# Patient Record
Sex: Female | Born: 1935 | Race: White | Hispanic: No | Smoking: Former smoker
Health system: Southern US, Community
[De-identification: ages and names within clinical notes are randomized; demographics above are authoritative.]

## PROBLEM LIST (undated history)

## (undated) DIAGNOSIS — Z9889 Other specified postprocedural states: Secondary | ICD-10-CM

## (undated) DIAGNOSIS — K219 Gastro-esophageal reflux disease without esophagitis: Secondary | ICD-10-CM

## (undated) DIAGNOSIS — I10 Essential (primary) hypertension: Secondary | ICD-10-CM

## (undated) DIAGNOSIS — Z01810 Encounter for preprocedural cardiovascular examination: Secondary | ICD-10-CM

## (undated) DIAGNOSIS — I4891 Unspecified atrial fibrillation: Secondary | ICD-10-CM

## (undated) DIAGNOSIS — Z8719 Personal history of other diseases of the digestive system: Secondary | ICD-10-CM

## (undated) DIAGNOSIS — D509 Iron deficiency anemia, unspecified: Secondary | ICD-10-CM

## (undated) DIAGNOSIS — E785 Hyperlipidemia, unspecified: Secondary | ICD-10-CM

## (undated) DIAGNOSIS — I2510 Atherosclerotic heart disease of native coronary artery without angina pectoris: Secondary | ICD-10-CM

## (undated) DIAGNOSIS — I219 Acute myocardial infarction, unspecified: Secondary | ICD-10-CM

## (undated) HISTORY — DX: Atherosclerotic heart disease of native coronary artery without angina pectoris: I25.10

## (undated) HISTORY — DX: Hyperlipidemia, unspecified: E78.5

## (undated) HISTORY — DX: Essential (primary) hypertension: I10

## (undated) HISTORY — DX: Unspecified atrial fibrillation: I48.91

## (undated) HISTORY — DX: Personal history of other diseases of the digestive system: Z87.19

## (undated) HISTORY — PX: EYE SURGERY: SHX253

## (undated) HISTORY — DX: Other specified postprocedural states: Z98.890

## (undated) HISTORY — DX: Iron deficiency anemia, unspecified: D50.9

## (undated) HISTORY — DX: Encounter for preprocedural cardiovascular examination: Z01.810

---

## 1999-03-19 ENCOUNTER — Inpatient Hospital Stay (HOSPITAL_COMMUNITY): Admission: EM | Admit: 1999-03-19 | Discharge: 1999-03-29 | Payer: Self-pay | Admitting: Emergency Medicine

## 1999-03-19 ENCOUNTER — Encounter: Payer: Self-pay | Admitting: Emergency Medicine

## 1999-03-26 ENCOUNTER — Encounter: Payer: Self-pay | Admitting: Cardiology

## 1999-03-27 ENCOUNTER — Encounter: Payer: Self-pay | Admitting: *Deleted

## 2000-03-20 ENCOUNTER — Emergency Department (HOSPITAL_COMMUNITY): Admission: EM | Admit: 2000-03-20 | Discharge: 2000-03-20 | Payer: Self-pay | Admitting: Emergency Medicine

## 2000-03-20 ENCOUNTER — Encounter: Payer: Self-pay | Admitting: Emergency Medicine

## 2000-03-31 HISTORY — PX: LAPAROSCOPIC CHOLECYSTECTOMY: SUR755

## 2000-05-20 ENCOUNTER — Emergency Department (HOSPITAL_COMMUNITY): Admission: EM | Admit: 2000-05-20 | Discharge: 2000-05-20 | Payer: Self-pay | Admitting: Emergency Medicine

## 2000-05-20 ENCOUNTER — Encounter: Payer: Self-pay | Admitting: General Surgery

## 2000-05-20 ENCOUNTER — Encounter: Payer: Self-pay | Admitting: Emergency Medicine

## 2000-05-21 ENCOUNTER — Observation Stay (HOSPITAL_COMMUNITY): Admission: RE | Admit: 2000-05-21 | Discharge: 2000-05-22 | Payer: Self-pay | Admitting: General Surgery

## 2000-05-21 ENCOUNTER — Encounter: Payer: Self-pay | Admitting: General Surgery

## 2000-05-21 ENCOUNTER — Encounter (INDEPENDENT_AMBULATORY_CARE_PROVIDER_SITE_OTHER): Payer: Self-pay | Admitting: Specialist

## 2003-05-22 ENCOUNTER — Inpatient Hospital Stay (HOSPITAL_COMMUNITY): Admission: EM | Admit: 2003-05-22 | Discharge: 2003-05-24 | Payer: Self-pay

## 2004-03-15 ENCOUNTER — Emergency Department (HOSPITAL_COMMUNITY): Admission: EM | Admit: 2004-03-15 | Discharge: 2004-03-15 | Payer: Self-pay | Admitting: *Deleted

## 2004-07-11 ENCOUNTER — Ambulatory Visit: Payer: Self-pay | Admitting: Cardiology

## 2004-07-17 ENCOUNTER — Encounter: Payer: Self-pay | Admitting: Cardiology

## 2004-07-17 ENCOUNTER — Ambulatory Visit: Payer: Self-pay

## 2004-07-25 ENCOUNTER — Ambulatory Visit: Payer: Self-pay | Admitting: Cardiology

## 2004-10-09 ENCOUNTER — Ambulatory Visit: Payer: Self-pay | Admitting: Cardiology

## 2004-12-24 ENCOUNTER — Encounter: Admission: RE | Admit: 2004-12-24 | Discharge: 2004-12-24 | Payer: Self-pay | Admitting: Orthopedic Surgery

## 2007-01-26 ENCOUNTER — Ambulatory Visit: Payer: Self-pay | Admitting: Cardiology

## 2008-01-12 ENCOUNTER — Ambulatory Visit: Payer: Self-pay | Admitting: Cardiology

## 2008-11-22 ENCOUNTER — Encounter: Payer: Self-pay | Admitting: Cardiology

## 2009-02-02 DIAGNOSIS — D509 Iron deficiency anemia, unspecified: Secondary | ICD-10-CM | POA: Insufficient documentation

## 2009-02-06 ENCOUNTER — Encounter: Payer: Self-pay | Admitting: Cardiology

## 2009-02-07 ENCOUNTER — Ambulatory Visit: Payer: Self-pay | Admitting: Cardiology

## 2009-02-27 ENCOUNTER — Ambulatory Visit: Payer: Self-pay | Admitting: Cardiology

## 2009-03-31 HISTORY — PX: UMBILICAL HERNIA REPAIR: SHX196

## 2009-05-09 ENCOUNTER — Encounter: Payer: Self-pay | Admitting: Cardiology

## 2009-05-17 ENCOUNTER — Telehealth: Payer: Self-pay | Admitting: Cardiology

## 2009-05-28 ENCOUNTER — Telehealth (INDEPENDENT_AMBULATORY_CARE_PROVIDER_SITE_OTHER): Payer: Self-pay | Admitting: *Deleted

## 2009-05-29 ENCOUNTER — Ambulatory Visit (HOSPITAL_COMMUNITY): Admission: RE | Admit: 2009-05-29 | Discharge: 2009-05-30 | Payer: Self-pay | Admitting: General Surgery

## 2009-06-14 ENCOUNTER — Encounter (INDEPENDENT_AMBULATORY_CARE_PROVIDER_SITE_OTHER): Payer: Self-pay | Admitting: *Deleted

## 2009-06-18 ENCOUNTER — Encounter: Payer: Self-pay | Admitting: Cardiology

## 2009-08-01 ENCOUNTER — Ambulatory Visit: Payer: Self-pay | Admitting: Cardiology

## 2010-03-14 ENCOUNTER — Ambulatory Visit: Payer: Self-pay | Admitting: Cardiology

## 2010-03-14 ENCOUNTER — Encounter: Payer: Self-pay | Admitting: Cardiology

## 2010-04-30 NOTE — Letter (Signed)
Summary: [facility] Surgical Clearance   Imported By: Roderic Ovens 06/01/2009 14:09:12  _____________________________________________________________________  External Attachment:    Type:   Image     Comment:   External Document

## 2010-04-30 NOTE — Progress Notes (Signed)
   Faxed 12 lead over to [facility] @ cone to fax [phone] [facility]  May 28, 2009 11:06 AM

## 2010-04-30 NOTE — Letter (Signed)
Summary: Appointment - Reminder 2  [employer], Main Office  [address]   [place], [place] [address]   Phone: [phone]  Fax: [phone]     June 14, 2009 MRN: [id number]   Martha Price [address] [place], [place]  [address]   Dear Ms. Gang,  Our records indicate that it is time to schedule a follow-up appointment with Dr. Myrtis Ser. It is very important that we reach you to schedule this appointment. We look forward to participating in your health care needs. Please contact us at the number listed above at your earliest convenience to schedule your appointment.  If you are unable to make an appointment at this time, give [place] a call so we can update our records.     Sincerely,   Migdalia Dk [facility] Scheduling Team

## 2010-04-30 NOTE — Assessment & Plan Note (Signed)
Summary: f69m   Visit Type:  Follow-up Primary Provider:  Wylene Simmer, MD  CC:  atrial fibrillation.  History of Present Illness: The patient is seen for followup of atrial fibrillation and coronary disease.  She is stable.  After long discussions in the past she has chosen not to take Coumadin.  On this basis I will also not recommend Pradaxa.  She's not having any chest pain.  She has no palpitations.  Current Medications (verified): 1)  Metoprolol Succinate 50 Mg Xr24h-Tab (Metoprolol Succinate) .... Take A  1/2 Tablet By Mouth Daily 2)  Pravastatin Sodium 40 Mg Tabs (Pravastatin Sodium) .... Take One Tablet By Mouth Daily At Bedtime 3)  Ramipril 10 Mg Caps (Ramipril) .... Take One Capsule By Mouth Two Times A Day 4)  Digoxin 0.25 Mg Tabs (Digoxin) .... Take One Tablet By Mouth Daily 5)  Aspirin 81 Mg Tbec (Aspirin) .... Take One Tablet By Mouth Daily 6)  Nitrostat 0.4 Mg Subl (Nitroglycerin) .Marland Kitchen.. 1 Tablet Under Tongue At Onset of Chest Pain; You May Repeat Every 5 Minutes For Up To 3 Doses. 7)  Icaps  Caps (Multiple Vitamins-Minerals) .... 2 Tabs Daily 8)  Xalatan 0.005 % Soln (Latanoprost) .... Uad 9)  Tylenol Extra Strength 500 Mg Tabs (Acetaminophen) .... As Needed 10)  Tums 500 Mg Chew (Calcium Carbonate Antacid) .... 3-4 Daily As Needed  Allergies (verified): No Known Drug Allergies  Past History:  Past Medical History: Dyslipidemia HYPERTENSION (ICD-401.9) ANEMIA, IRON DEFICIENCY (ICD-280.9) CAD... remote anterior MI with PTCA to the LAD  /   DES.Marland Kitchen RCA  February, 2005.. 70% circumflex at that time  /   nuclear scan.Marland Kitchen April, 2006... EF 75%... mild ischemia distal anterior wall Atrial fibrillation... episode in the remote past  /  atrial fibrillation noted  February 07, 2009 Umbilical hernia repair... March, 2011...Dr. Abbey Chatters    Review of Systems       Patient denies fever, chills, headache, sweats, rash, change in vision, change in hearing, chest pain, cough, nausea  vomiting, urinary symptoms.  All the systems are reviewed and are negative.  Vital Signs:  Patient profile:   77 year old female Height:      63.5 inches Weight:      126 pounds BMI:     22.05 Pulse rate:   75 / minute Pulse rhythm:   irregular BP sitting:   184 / 72  (left arm) Cuff size:   regular  Vitals Entered By: Stanton Kidney, EMT-P (Aug 01, 2009 11:22 AM)  Physical Exam  General:  she is quite stable in general. Eyes:  no xanthelasma. Neck:  no jugular venous distention. Lungs:  lungs are clear.  Respiratory effort is nonlabored. Heart:  cardiac exam reveals an S1-S2.  No clicks or significant murmurs.  The rhythm is irregularly irregular. Abdomen:  abdomen is soft. Extremities:  no peripheral edema. Psych:  patient is oriented to person time and place affect is normal.   Impression & Recommendations:  Problem # 1:  ATRIAL FIBRILLATION, PAROXYSMAL (ICD-427.31)  Her updated medication list for this problem includes:    Metoprolol Succinate 50 Mg Xr24h-tab (Metoprolol succinate) .Marland Kitchen... Take a  1/2 tablet by mouth daily    Digoxin 0.25 Mg Tabs (Digoxin) .Marland Kitchen... Take one tablet by mouth daily    Aspirin 81 Mg Tbec (Aspirin) .Marland Kitchen... Take one tablet by mouth daily  The patient has atrial fibrillation.  Rate is controlled.  Patient refuses Coumadin.  I feel that it is not warranted  to recommend Pradaxa in this setting..  She is on aspirin.  Orders: EKG w/ Interpretation (93000)  Problem # 2:  CAD (ICD-414.00)  Her updated medication list for this problem includes:    Metoprolol Succinate 50 Mg Xr24h-tab (Metoprolol succinate) .Marland Kitchen... Take a  1/2 tablet by mouth daily    Ramipril 10 Mg Caps (Ramipril) .Marland Kitchen... Take one capsule by mouth two times a day    Aspirin 81 Mg Tbec (Aspirin) .Marland Kitchen... Take one tablet by mouth daily    Nitrostat 0.4 Mg Subl (Nitroglycerin) .Marland Kitchen... 1 tablet under tongue at onset of chest pain; you may repeat every 5 minutes for up to 3 doses. Coronary disease is  stable.  EKG is done and reviewed by me.  There is atrial fibrillation with a controlled rate and nonspecific ST-T wave changes.  Problem # 3:  HYPERTENSION (ICD-401.9)  Her updated medication list for this problem includes:    Metoprolol Succinate 50 Mg Xr24h-tab (Metoprolol succinate) .Marland Kitchen... Take a  1/2 tablet by mouth daily    Ramipril 10 Mg Caps (Ramipril) .Marland Kitchen... Take one capsule by mouth two times a day    Aspirin 81 Mg Tbec (Aspirin) .Marland Kitchen... Take one tablet by mouth daily systolic blood pressure is elevated today.  I recommended that she have followup blood pressure checks and see her primary M.D.  Patient Instructions: 1)  Follow up with your Primary Care MD regarding your elevated BP today 2)  Follow up with Dr Myrtis Ser in 6 months

## 2010-04-30 NOTE — Letter (Signed)
Summary: Dr Jim Desanctis Rosenbower's Office Note  Dr Jim Desanctis Rosenbower's Office Note   Imported By: Roderic Ovens 07/12/2009 14:03:43  _____________________________________________________________________  External Attachment:    Type:   Image     Comment:   External Document

## 2010-04-30 NOTE — Progress Notes (Signed)
Summary: surg clearance   ---- Converted from flag ---- ---- 05/14/2009 11:13 AM, Cala Bradford Mesiemore wrote: Forrestine Him,  Spoke with Dr.Rosenbauer's office they are needing Surgical Clearnace on this Pt.   [phone] Fax:[phone]    Thanks, Selena Batten ------------------------------  Phone Note Other Incoming   Summary of Call: per Dr Myrtis Ser pt is cleared, surg clearance form was faxed back to [facility], RN  May 17, 2009 10:43 AM

## 2010-05-02 NOTE — Assessment & Plan Note (Signed)
Summary: f24m  Medications Added TIMOLOL MALEATE 0.5 % SOLN (TIMOLOL MALEATE) once daily      Allergies Added: NKDA  Visit Type:  Follow-up Primary Martha Price:  Guerry Bruin, MD  CC:  atrial fibrillation.  History of Present Illness: The patient is seen for followup of atrial fibrillation.  She has refused Coumadin in the past.  She is stable.  She has coronary disease and is stable.  She's not having chest pain or shortness of breath. She does not feel any significant palpitations.  Current Medications (verified): 1)  Metoprolol Succinate 50 Mg Xr24h-Tab (Metoprolol Succinate) .... Take A  1/2 Tablet By Mouth Daily 2)  Pravastatin Sodium 40 Mg Tabs (Pravastatin Sodium) .... Take One Tablet By Mouth Daily At Bedtime 3)  Ramipril 10 Mg Caps (Ramipril) .... Take One Capsule By Mouth Two Times A Day 4)  Digoxin 0.25 Mg Tabs (Digoxin) .... Take One Tablet By Mouth Daily 5)  Aspirin 81 Mg Tbec (Aspirin) .... Take One Tablet By Mouth Daily 6)  Nitrostat 0.4 Mg Subl (Nitroglycerin) .Marland Kitchen.. 1 Tablet Under Tongue At Onset of Chest Pain; You May Repeat Every 5 Minutes For Up To 3 Doses. 7)  Icaps  Caps (Multiple Vitamins-Minerals) .... 2 Tabs Daily 8)  Xalatan 0.005 % Soln (Latanoprost) .... Uad 9)  Tylenol Extra Strength 500 Mg Tabs (Acetaminophen) .... As Needed 10)  Tums 500 Mg Chew (Calcium Carbonate Antacid) .... 3-4 Daily As Needed 11)  Timolol Maleate 0.5 % Soln (Timolol Maleate) .... Once Daily  Allergies (verified): No Known Drug Allergies  Past History:  Past Medical History: Dyslipidemia HYPERTENSION (ICD-401.9) ANEMIA, IRON DEFICIENCY (ICD-280.9) CAD... remote anterior MI with PTCA to the LAD  /   DES.Marland Kitchen RCA  February, 2005.. 70% circumflex at that time  /   nuclear scan.Marland Kitchen April, 2006... EF 75%... mild ischemia distal anterior wall Atrial fibrillation... episode in the remote past  /  atrial fibrillation noted  February 07, 2009 Umbilical hernia repair... March, 2011...Dr.  Abbey Chatters..    Review of Systems       Patient denies fever, chills, headache, sweats, rash, change in vision, change in hearing, chest pain, cough, nausea vomiting, urinary symptoms.  All other systems are reviewed and are negative  Vital Signs:  Patient profile:   77 year old female Height:      63.5 inches Weight:      129 pounds BMI:     22.57 Pulse rate:   70 / minute BP sitting:   124 / 72  (left arm) Cuff size:   regular  Vitals Entered By: Hardin Negus, RMA (March 14, 2010 3:06 PM)  Physical Exam  General:  patient looks quite good today. Eyes:  no xanthelasma Neck:  no jugular distention. Lungs:  lungs are clear.  Respiratory effort is nonlabored. Heart:  cardiac exam reveals S1-S2.  No clicks or significant murmurs. Abdomen:  abdomen is soft Extremities:  no peripheral edema. Psych:  patient is oriented to person time and place her affect is normal.   Impression & Recommendations:  Problem # 1:  ATRIAL FIBRILLATION, PAROXYSMAL (ICD-427.31)  Her updated medication list for this problem includes:    Metoprolol Succinate 50 Mg Xr24h-tab (Metoprolol succinate) .Marland Kitchen... Take a  1/2 tablet by mouth daily    Digoxin 0.25 Mg Tabs (Digoxin) .Marland Kitchen... Take one tablet by mouth daily    Aspirin 81 Mg Tbec (Aspirin) .Marland Kitchen... Take one tablet by mouth daily The patient's atrial fib is very stable.  She is  on digoxin.  She's had a good level in the past but this is a high dose for a patient of her age.  Digoxin level will be checked today.  Orders: T-Digoxin ([id number])  Problem # 2:  CAD (ICD-414.00)  Her updated medication list for this problem includes:    Metoprolol Succinate 50 Mg Xr24h-tab (Metoprolol succinate) .Marland Kitchen... Take a  1/2 tablet by mouth daily    Ramipril 10 Mg Caps (Ramipril) .Marland Kitchen... Take one capsule by mouth two times a day    Aspirin 81 Mg Tbec (Aspirin) .Marland Kitchen... Take one tablet by mouth daily    Nitrostat 0.4 Mg Subl (Nitroglycerin) .Marland Kitchen... 1 tablet under tongue  at onset of chest pain; you may repeat every 5 minutes for up to 3 doses. Coronary disease is stable.  She needs no further workup.  Problem # 3:  HYPERTENSION (ICD-401.9)  Her updated medication list for this problem includes:    Metoprolol Succinate 50 Mg Xr24h-tab (Metoprolol succinate) .Marland Kitchen... Take a  1/2 tablet by mouth daily    Ramipril 10 Mg Caps (Ramipril) .Marland Kitchen... Take one capsule by mouth two times a day    Aspirin 81 Mg Tbec (Aspirin) .Marland Kitchen... Take one tablet by mouth daily The time of her last visit her blood pressure was somewhat elevated.  It is under control today.  She thinks an additional medicine has been added but I can document only the addition of her timolol eyedrops. Regardless, her blood pressure is stable, and no therapy changes needed.  Patient Instructions: 1)  Your physician wants you to follow-up in:  6 months.  You will receive a reminder letter in the mail two months in advance. If you don't receive a letter, please call our office to schedule the follow-up appointment.

## 2010-06-21 LAB — COMPREHENSIVE METABOLIC PANEL
ALT: 16 U/L (ref 0–35)
AST: 27 U/L (ref 0–37)
Albumin: 3.7 g/dL (ref 3.5–5.2)
Alkaline Phosphatase: 52 U/L (ref 39–117)
BUN: 13 mg/dL (ref 6–23)
CO2: 29 mEq/L (ref 19–32)
GFR calc Af Amer: >60 mL/min (ref >60)
Total Bilirubin: 0.5 mg/dL (ref 0.3–1.2)
Total Protein: 6.6 g/dL (ref 6.0–8.3)

## 2010-06-21 LAB — DIFFERENTIAL
Basophils Absolute: 0 10*3/uL (ref 0.0–0.1)
Basophils Relative: 1 % (ref 0–1)
Eosinophils Absolute: 0.2 10*3/uL (ref 0.0–0.7)
Eosinophils Relative: 2 % (ref 0–5)
Monocytes Absolute: 0.6 10*3/uL (ref 0.1–1.0)
Monocytes Relative: 8 % (ref 3–12)
Neutro Abs: 4.2 10*3/uL (ref 1.7–7.7)

## 2010-06-21 LAB — CBC
Hemoglobin: 13.6 g/dL (ref 12.0–15.0)
MCHC: 34.3 g/dL (ref 30.0–36.0)
MCV: 97.6 fL (ref 78.0–100.0)
RBC: 4.08 MIL/uL (ref 3.87–5.11)
RDW: 13.8 % (ref 11.5–15.5)

## 2010-08-13 NOTE — Assessment & Plan Note (Signed)
[facility]                            CARDIOLOGY OFFICE NOTE   Martha Price, Martha Price                       MRN:          [id number]  DATE:01/26/2007                            DOB:          18-Nov-1921    Martha Price is doing very well.  I have not seen her since July of  2006.  She has been stable.  She has not been having any chest pain.  She is watched carefully by Dr. Wylene Simmer.  She is going about full  activities.  She did receive a Cypher stent in 2005 and has been on  aspirin and Plavix since then.   PAST MEDICAL HISTORY:  Allergies:  No known drug allergies.  Medications:  Lanoxin, Altace, Plavix (to be stopped today), aspirin 81,  Pravachol 40, Toprol XL.  Other medical problems:  See the list below.   REVIEW OF SYSTEMS:  She is doing well and her review of systems is  negative.   PHYSICAL EXAMINATION:  Blood pressure is 150/76 with a pulse of 61.  The  patient is oriented to person, time and place and her affect is normal.  HEENT:  Reveals no xanthelasmas.  She has normal extraocular motion.  NECK:  There are no carotid bruits.  There is no jugular venous  distention.  LUNGS:  Clear.  Respiratory effort is not labored.  CARDIAC:  Exam reveals a S1, S2.  There are clicks or significant  murmurs.  She has no peripheral edema.  EKG reveals sinus rhythm.   PROBLEMS INCLUDE:  1. Coronary disease with a Cypher stent 2005, stable.  She does not      need any testing at this time.  2. Normal LV function.  3. Hypercholesterolemia on medication.  4. Episode of atrial fibrillation in the remote past with no      recurrence.  5. History of GERD.   She is stable at this point.  She will need blood pressure followup with  Dr. Wylene Simmer.  She is now 3.5 years out from her Cypher stent.  I feel it  is most appropriate in her case to stop her Plavix and to remain on  aspirin and I instructed her to do so.  I will see her back in one year.     Martha Abed, MD, [facility]  Electronically Signed    JDK/MedQ  DD: 01/26/2007  DT: 01/26/2007  Job #: [id number]   cc:   Martha Price, M.D.

## 2010-08-13 NOTE — Assessment & Plan Note (Signed)
[facility]                            CARDIOLOGY OFFICE NOTE   EMMANUEL, ERCOLE                       MRN:          [id number]  DATE:01/12/2008                            DOB:          04-05-1921    Ms. Liao is here for the followup of her coronary artery disease.  I  saw her last on January 26, 2007.  She is stable.  She has some leg  discomfort, but no chest pain.  She had a Cypher stent placed in 2005,  and we stopped her Plavix in 2008 and she has been stable without any  difficulties.  She is not having chest pain or shortness of breath.  She  has no syncope or presyncope.   PAST MEDICAL HISTORY:   ALLERGIES:  No known drug allergies.   MEDICATIONS:  Lanoxin, aspirin, Pravachol, metoprolol, and ramipril.   OTHER MEDICAL PROBLEMS:  See the list below.   REVIEW OF SYSTEMS:  She is not having any GI or GU symptoms.  She is not  having any headaches or fevers.  There is no arthralgias.  She does have  some leg discomfort.  Otherwise, review of systems is negative.   PHYSICAL EXAMINATION:  VITAL SIGNS:  Blood pressure today systolic is  somewhat elevated at 176/66.  We will ask her to follow up with Dr.  Wylene Simmer for further blood pressure check.  GENERAL:  The patient is oriented to person, time, and place.  Affect is  normal.  HEENT:  No xanthelasma.  She has normal extraocular motion.  NECK:  There are no carotid bruits.  There is no jugular venous  distention.  LUNGS:  Clear.  Respiratory effort is not labored.  CARDIAC:  S1 with an S2.  There are no clicks or significant murmurs.  ABDOMEN:  Soft.  She has no peripheral edema.   EKG is reviewed.  She has nonspecific ST-T wave changes.   The patient is stable.  She needs no testing.  There is no need to  change any of her medications.  I have recommended that she see Dr.  Wylene Simmer to follow up her blood pressure.  I will see her back in 1 year.     Luis Abed, MD, [facility]  Electronically Signed   JDK/MedQ  DD: 01/12/2008  DT: 01/13/2008  Job #: [id number]   cc:   Gaspar Garbe, M.D.

## 2010-08-16 NOTE — Discharge Summary (Signed)
NAME:  Martha Price, Martha Price NO.:  [id number]   MEDICAL RECORD NO.:  [id number]                   PATIENT TYPE:  INP   LOCATION:  6531                                 FACILITY:  [facility]   PHYSICIAN:  [place] Bing, M.D.               DATE OF BIRTH:  Aug 15, 1921   DATE OF ADMISSION:  05/22/2003  DATE OF DISCHARGE:  05/24/2003                                 DISCHARGE SUMMARY   DISCHARGE DIAGNOSES:  1. Admitted with chest pain with some exertional component, initial cardiac     enzymes negative, electrocardiogram nondiagnostic.  2. Finding of high-grade lesion in mid right coronary artery disease, status     post stent of the lesion, reducing a 95% stenosis to zero.   SECONDARY DIAGNOSES:  1. History of coronary artery disease.  2. Status post acute myocardial infarction in December 2000 with     percutaneous coronary intervention to the left anterior descending.     There is residual disease, a mid point 50% left anterior descending     stenosis, 90% stenosis in diagonal 1 and diagonal 2, moderate left     circumflex, right coronary artery and posterior descending coronary     artery disease, ejection fraction 30%.  3. Post myocardial infarction atrial fibrillation now resolved.  4. Dyslipidemia.  5. Hypertension.  6. Iron deficiency anemia.  7. Status post cholecystectomy, 2003.  8. Remote tobacco habituation.   PROCEDURE:  1. May 23, 2003, Dr. Charlies Constable.  The study is a left heart     catheterization by way of the right femoral artery.  The left anterior     descending was without significant disease.  The left circumflex had a     70% stenosis.  The right coronary artery had a high-grade 95% mid-point     stenosis which was reduced by way of stenting from 95% to 0%.  Left     ventricular function seemed to be normal.  The patient tolerated the     procedure well and was ready for discharge 24 hours post procedure.   DISCHARGE DISPOSITION:   Ms. Ayaat Jansma is ready for discharge, post-  procedure day number 1, May 24, 2003.  She is not having any pain at  the catheterization site.  She has good perfusion to the right lower  extremity.  The catheterization site itself has no swelling, drainage or  edema; there was some mild post-catheterization oozing which has now  stopped.   DISCHARGE MEDICATIONS:  The patient goes home on the following medications:  1. Enteric-coated aspirin 325 mg daily.  2. Plavix 75 mg daily for 9 months, a new medication.  3. Pravachol to take as before the admission, at bedtime daily.  4. Toprol-XL 25 mg daily.  5. Altace 5 mg twice daily.  6. Digoxin to continue as before admission.  7. Nitroglycerin 0.4 mg 1 tablet under tongue every 5  minutes x3 doses as     needed for chest pain.  8. For pain at the catheterization site, Tylenol 325 mg 1-2 tabs ever 4-6     hours as needed.   ACTIVITY:  She is asked not to engage in heavy lifting for 1 week, not to  drive until Saturday, May 27, 2003.   DISCHARGE DIET:  Low-sodium, low-cholesterol diet.   WOUND CARE:  She may shower.  She is to call [facility] at [phone]  if she experiences swelling or pain at the catheterization site.   FOLLOWUP:  She has a followup with Dr. Myrtis Ser, Thursday, June 08, 2003 at  11:30 in the morning.   BRIEF HISTORY:  Ms. Nadra Hritz is a 77 year old female presenting to  [facility] Emergency Room after  3 hours of anterior chest pain; this developed suddenly at rest; she  describes it as a 6/10.  She also has an achy left arm.  It is not  accompanied by shortness of breath, nausea, vomiting or diaphoresis.  It is  similar to chest pain she had when having an acute myocardial infarction in  08/13/98 but not as pronounced.  She has been having some episodes in the last  couple of weeks and has used nitroglycerin for the past 2 weeks, consuming  12 tablets.  Today, she took 2 sublingual nitroglycerin and  the pain had  dissipated; however, the chest pain did return after about 30 minutes and  was relieved with one more sublingual nitroglycerin.  Son made the patient  come to the emergency room.  The pain is not positional and not worse with  deep inspiration.   HOSPITAL COURSE:  The patient was admitted to [facility] through  the emergency room on May 22, 2003 complaining of chest pain with an  achy left arm, somewhat similar to pain preceding a myocardial infarction in  the year 08-13-1998.  The patient was stabilized on IV nitroglycerin and IV  heparin.  She underwent left heart catheterization on May 24, 2003 with  the finding as described above.  The patient has tolerated the procedure  well and is ready for discharge, post-procedure day #1.   LABORATORY DATA THIS ADMISSION:  Cardiac enzymes were in serial fashion,  troponin I less than 0.05, then 0.01, then less than 0.01.  Complete blood  count after procedure, May 24, 2003:  White cells 5.3, hemoglobin 11.8,  hematocrit 33.9, platelets 145,000.  Cholesterol, this admission, was 150.  triglycerides 139, HDL cholesterol 37, LDL cholesterol 85.      Maple Mirza, P.A.                    [place] Bing, M.D.    GM/MEDQ  D:  05/24/2003  T:  05/25/2003  Job:  [id number]   cc:   Willa Rough, M.D.   Gaspar Garbe, M.D.  [address]  [place]  [place] [address]  Fax: [phone]

## 2010-08-16 NOTE — H&P (Signed)
NAME:  Martha Price, Martha Price NO.:  [id number]   MEDICAL RECORD NO.:  [id number]                   PATIENT TYPE:  INP   LOCATION:  1828                                 FACILITY:  [facility]   PHYSICIAN:  Matlock Bing, M.D.               DATE OF BIRTH:  [birth year]   DATE OF ADMISSION:  05/22/2003  DATE OF DISCHARGE:                                HISTORY & PHYSICAL   HISTORY OF PRESENT ILLNESS:  A 77 year old woman with known coronary disease  presenting with chest pain.  Martha Price history dates to December 2000  when she presented with an acute anterior myocardial infarction.  Coronary  angiography revealed total occlusion of the LAD treated with percutaneous  intervention.  She had substantial residual disease including a 50% mid-LAD,  90% first and second diagonal lesions, moderate circumflex diagonal, and  distal RCA and PDA disease.  Ejection fraction was 0.30.  Her post  myocardial infarction course was complicated by paroxysmal atrial  fibrillation, but this eventually resolved, and she has subsequently done  quite well.  She has had no cardiac testing since her myocardial infarction.  She now describes approximately a 64-month history of intermittent chest  discomfort.  She reports relief with nitroglycerin, but the time course is  not consistent with a NTG affect.  Symptoms typically have their onset at  rest and resolve spontaneously.  She reports an ill-defined diffuse anterior  discomfort associated with aching in the left arm.  There is no accompanying  nausea, diaphoresis, nor dyspnea.  These symptoms are somewhat similar to  those she experienced earlier, but not nearly as intense.  She comes in  today because of increased duration and frequency of discomfort.  There is  no relationship to exertion.   CURRENT MEDICATIONS:  1. Ramipril 5 mg b.i.d.  2. Digoxin 0.25 mg daily.  3. Toprol-XL 25 mg daily.  4. Pravastatin 80 mg daily.  5. Aspirin  81 mg daily.  6. Sublingual nitroglycerin.   ALLERGIES:  The patient reports no allergies.   PAST MEDICAL HISTORY:  Otherwise notable for -  1. Hyperlipidemia.  2. Iron-deficiency anemia.  3. Hypertension.   PAST SURGICAL HISTORY:  Her only surgery has been a cholecystectomy  performed in 2003.   SOCIAL HISTORY:  Lives in [place] with her husband.  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  Married with multiple grandchildren.  Discontinued use of  tobacco in 2000, but consumption up to that time was no more than 10 pack  years.  Drinks one glass of red wine per day.   FAMILY HISTORY:  Mother died at age 47 of degenerative disease.  Father died  at age 65 of myocardial infarction.  Has 3 sisters and 1 brother, none of  whom have had vascular disease.   REVIEW OF SYSTEMS:  Notable for impaired vision requiring reading glasses.  Minor skin abnormalities including a mole  on her face, and a sebaceous cyst  on her back.  Pain in her legs with exertion that she attributes to  arthritis.  All other systems reviewed and are negative.   PHYSICAL EXAMINATION:  GENERAL:  A pleasant, trim woman.  VITAL SIGNS:  Temperature is 97, heart rate 65 and regular, respirations 12,  blood pressure 160/75, O2 saturation 100% on 2 liters.  HEENT:  Anicteric sclerae.  NECK:  No jugular venous distention.  Faint right carotid bruit.  ENDOCRINE:  No thyromegaly.  HEMATOPOIETIC:  No adenopathy.  SKIN:  Cyst over the right mid back.  LUNGS:  Clear.  CARDIAC:  Normal first and second heart sounds.  Fourth heart sound present.  ABDOMEN:  Soft and nontender.  No bruits.  No organomegaly.  EXTREMITIES:  No edema.  Bounding distal pulses.  NEUROMUSCULAR:  Symmetric strength and tone.  MUSCULOSKELETAL:  No joint deformity.   CHEST X-RAY:  Cardiomegaly.  Degenerative changes in the thoracic spine.  Right retrocardiac nodule.   EKG:  Normal sinus rhythm.  Minor nonspecific ST-T wave abnormality.   Initial PSE  markers are negative.   IMPRESSION:  Martha Price presents with resting chest discomfort that is  somewhat worrisome for angina, but certainly not classic.  She has no  diagnostic EKG abnormalities.  The initial markers are negative.  Her exam  is relatively benign.  Because of the ongoing nature of her symptoms, I  think it likely that they will continue unless specific etiology is  determined.  Accordingly, I have recommended cardiac catheterization.  The  risks and benefits were reviewed with her.  She agrees to proceed.   Blood pressure control somewhat suboptimal.  Bed rest and intravenous  nitroglycerin should be of assistance in this regard.  We will adjust her  antihypertensive medicines as required.   Initial medical treatment will consist of her usual regime plus intravenous  nitroglycerin and heparin.  If chest discomfort recurs, consideration will  be given to a IIB-IIIA drug.   After her cardiac evaluation and treatment has been completed, she will  require further evaluation, or at least comparison of prior chest x-rays  with respect to the abnormality described on her current film.                                                Kingsbury Bing, M.D.    RR/MEDQ  D:  05/22/2003  T:  05/23/2003  Job:  [id number]   cc:   Dr. Albin Felling(?)   Willa Rough, M.D.

## 2010-08-16 NOTE — Cardiovascular Report (Signed)
NAME:  Martha, Price NO.:  [id number]   MEDICAL RECORD NO.:  [id number]                   PATIENT TYPE:  INP   LOCATION:  6531                                 FACILITY:  [facility]   PHYSICIAN:  Charlies Constable, M.D.                  DATE OF BIRTH:  07-23-1921   DATE OF PROCEDURE:  05/23/2003  DATE OF DISCHARGE:  05/24/2003                              CARDIAC CATHETERIZATION   PROCEDURE:  Cardiac catheterization.   CARDIOLOGIST:  Charlies Constable, M.D.   INDICATIONS:  [name] is 77 years old and in 2000 had an anterior wall  infarction treated with PTCA of the LAD by the Dr. Riley Kill.  She has been  having intermittent chest pain over the last month which became worse today  and she was seen in the emergency room by Dr. Dietrich Pates who admitted her and  scheduled her for urgent catheterization.   DESCRIPTION OF PROCEDURE:  The procedure was performed via the right femoral  artery using an arterial sheath and 6 French preformed coronary catheters.  A front wall arterial puncture was performed, and Omnipaque contrast was  used.  After completion of the diagnostic study, we made the decision to  proceed with intervention on the right coronary artery.   The patient was given weight-adjusted heparin to prolong the ACT to greater  than 200 seconds and was given double bolus Integrilin and infusion.  She  was given 300 mg of Plavix at the end of the procedure.  We used a 6 Sudan guiding catheter with side holes and a PT2 light support wire.  We  crossed the lesion in the mid right coronary artery with the wire without  difficulty.  We predilated with a 4.0 x 15 mm Quantum Maverick performing  one inflation up to 6 atmospheres for 30 seconds.  We then deployed a 3.5 x  18 mm Cypher stent deploying this with one inflation of 16 atmospheres for  30 seconds. We post dilated with the 4.0 x 50 mm Quantum Maverick performing  one inflation up to 12 atmospheres for  30 seconds.  Repeat study was  performed through the guiding catheter.  The patient tolerated the procedure  well and left the laboratory in satisfactory condition.   RESULTS:  The left main coronary artery:  The left main coronary artery was  free of significant disease.   Left anterior descending:  The left anterior descending artery gave rise to  two diagonal branches and two septal perforators.  There was 50% narrowing  in the first diagonal branch.  There was 50% narrowing in the mid LAD after  the second diagonal branch.  There was 50% narrowing in the mid LAD after  the second diagonal branch.  There were irregularities in the proximal and  mid LAD.   The circumflex artery gave rise to a ramus branch and two posterior lateral  branches.  There was 70% narrowing in the proximal portion of the circumflex  artery after the ramus branch.   The right coronary artery was a large dominant vessel that gave rise to two  right ventricular branches and a posterior descending branch and two  posterior lateral branches.  There was a 95% stenosis in the midportion of  the vessel which was focal.  There was 50% ostial stenosis in the posterior  descending branch with tandem 40% stenosis in the mid vessel.   LEFT VENTRICULOGRAPHY:  The left ventriculogram was performed in the RAO  projection and showed good wall motion with no areas of hypokinesis.  The  estimated ejection fraction of 60%.   Following stenting of the lesion in the mid right coronary artery, the  stenosis improved from 95% to 0%,.   CONCLUSION:  1. Coronary artery disease status post remote anterior wall infarction     treated with PTCA of the LAD with 50% narrowing in the mid LAD, 50%     narrowing in the first diagonal branch to the LAD, 70% narrowing in the     proximal circumflex artery and 95% stenosis in the mid right coronary     artery and 50% and 40% stenoses in the posterior descending branch of the     right  coronary artery with normal LV function.  2. Successful stenting of the lesion in the mid right coronary artery with a     Cypher stent with improvement in percent diameter narrowing from 95% to     0%.   DISPOSITION:  The patient was returned to post angioplasty unit for further  observation.                                               Charlies Constable, M.D.    BB/MEDQ  D:  05/23/2003  T:  05/24/2003  Job:  [id number]   cc:   Gaspar Garbe, M.D.  [address]  [place]  [place] [address]  Fax: [phone]   Willa Rough, M.D.   Cardiopulmonary Lab

## 2010-09-16 ENCOUNTER — Other Ambulatory Visit: Payer: Self-pay | Admitting: Cardiology

## 2010-09-24 ENCOUNTER — Encounter: Payer: Self-pay | Admitting: Cardiology

## 2010-10-30 ENCOUNTER — Encounter: Payer: Self-pay | Admitting: Cardiology

## 2010-11-07 ENCOUNTER — Encounter: Payer: Self-pay | Admitting: Cardiology

## 2010-11-07 DIAGNOSIS — E785 Hyperlipidemia, unspecified: Secondary | ICD-10-CM | POA: Insufficient documentation

## 2010-11-07 DIAGNOSIS — I10 Essential (primary) hypertension: Secondary | ICD-10-CM | POA: Insufficient documentation

## 2010-11-07 DIAGNOSIS — I2510 Atherosclerotic heart disease of native coronary artery without angina pectoris: Secondary | ICD-10-CM | POA: Insufficient documentation

## 2010-11-07 DIAGNOSIS — I4891 Unspecified atrial fibrillation: Secondary | ICD-10-CM | POA: Insufficient documentation

## 2010-11-08 ENCOUNTER — Ambulatory Visit (INDEPENDENT_AMBULATORY_CARE_PROVIDER_SITE_OTHER): Payer: Medicare Other | Admitting: Cardiology

## 2010-11-08 ENCOUNTER — Encounter: Payer: Self-pay | Admitting: Cardiology

## 2010-11-08 VITALS — BP 134/80 | HR 71 | Ht 65.5 in | Wt 125.0 lb

## 2010-11-08 DIAGNOSIS — I4891 Unspecified atrial fibrillation: Secondary | ICD-10-CM

## 2010-11-08 DIAGNOSIS — I2510 Atherosclerotic heart disease of native coronary artery without angina pectoris: Secondary | ICD-10-CM

## 2010-11-08 NOTE — Patient Instructions (Signed)
Your physician recommends that you schedule a follow-up appointment in: 6 months. The office will mail you a reminder letter 2 months prior appointment date. Your physician recommends that you continue on your current medications as directed. Please refer to the Current Medication list given to you today. 

## 2010-11-08 NOTE — Assessment & Plan Note (Signed)
Atrial fibrillation rate is controlled.  The patient has refused Coumadin in the past.  She is stable.  No further workup.

## 2010-11-08 NOTE — Progress Notes (Signed)
HPI Patient is seen today for cardiology followup.  She is doing very well.  I saw her last in the office December, 2011 she does have coronary disease.  She's been stable.  She had an intervention in 2005.  Nuclear scan was done in 2006.  She does say that she's had some slight chest discomfort.  However it has always been at rest.  She has used a nitroglycerin.  There is no exertional component.  There is no shortness of breath. No Known Allergies  Current Outpatient Prescriptions  Medication Sig Dispense Refill  . amLODipine (NORVASC) 5 MG tablet Take 1 tablet by mouth Daily.      Marland Kitchen aspirin 81 MG tablet Take 81 mg by mouth daily.        . benazepril (LOTENSIN) 40 MG tablet Take 1 tablet by mouth Daily.      . digoxin (LANOXIN) 0.25 MG tablet Take 250 mcg by mouth daily.        . dorzolamide-timolol (COSOPT) 22.3-6.8 MG/ML ophthalmic solution as directed.      . latanoprost (XALATAN) 0.005 % ophthalmic solution as directed.      . metoprolol (TOPROL-XL) 50 MG 24 hr tablet TAKE 1/2 TABLET BY MOUTH DAILY  15 tablet  5  . Multiple Vitamins-Minerals (ICAPS MV PO) Take 2 tablets by mouth daily.        . nitroGLYCERIN (NITROSTAT) 0.4 MG SL tablet Place 0.4 mg under the tongue every 5 (five) minutes as needed.        . pravastatin (PRAVACHOL) 40 MG tablet Take 40 mg by mouth daily.        . ramipril (ALTACE) 10 MG capsule Take 10 mg by mouth 2 (two) times daily.          History   Social History  . Marital Status: Married    Spouse Name: N/A    Number of Children: N/A  . Years of Education: N/A   Occupational History  . Not on file.   Social History Main Topics  . Smoking status: Former Games developer  . Smokeless tobacco: Not on file  . Alcohol Use: Yes  . Drug Use: Not on file  . Sexually Active: Not on file   Other Topics Concern  . Not on file   Social History Narrative  . No narrative on file    Family History  Problem Relation Age of Onset  . Heart attack Father     Past  Medical History  Diagnosis Date  . Dyslipidemia   . HTN (hypertension)   . Iron deficiency anemia   . CAD (coronary artery disease)     remote anterior MI with PTCA to LAD  /  DES to RCA February, 2005, 70% circumflex  /  nuclear, April, 2006, EF 75%, mild ischemia distal anterior wall  . Atrial fibrillation     refused Coumadin in the past., atrial fib chronic as of November, 2010  . History of umbilical hernia repair     No past surgical history on file.  ROS  Patient denies fever, chills, headache, sweats, rash, change in vision, change in hearing, cough, nausea vomiting, urinary symptoms.  All other systems are reviewed and are negative. She does have some arthritic pain.  We discussed the fact that Tylenol would be the best medication.  PHYSICAL EXAM Patient is stable.  She is here with her son.She is oriented to person time and place.  Affect is normal.  Head is atraumatic.  There is  no jugular venous distention.  Lungs are clear.  Respiratory effort is unlabored.  Cardiac exam reveals S1 and S2.  There are no clicks or significant murmurs.  The abdomen is soft there is no peripheral edema. Filed Vitals:   11/08/10 1515  BP: 134/80  Pulse: 71  Height: 5' 5.5" (1.664 m)  Weight: 125 lb (56.7 kg)    EKG Is done today and reviewed by me.  She has atrial fibrillation.  The rate is controlled.  ASSESSMENT & PLAN

## 2010-11-08 NOTE — Assessment & Plan Note (Signed)
Her coronary disease is stable.  She has had some rare intermittent rest chest pain.  We will follow the pattern carefully.  If she has exertional symptoms or more frequent rest symptoms I will be more aggressive.

## 2011-03-10 ENCOUNTER — Other Ambulatory Visit: Payer: Self-pay | Admitting: Cardiology

## 2011-05-20 ENCOUNTER — Encounter: Payer: Self-pay | Admitting: Cardiology

## 2011-05-20 ENCOUNTER — Ambulatory Visit (INDEPENDENT_AMBULATORY_CARE_PROVIDER_SITE_OTHER): Payer: Medicare Other | Admitting: Cardiology

## 2011-05-20 VITALS — BP 132/68 | HR 66 | Ht 65.0 in | Wt 123.0 lb

## 2011-05-20 DIAGNOSIS — I4891 Unspecified atrial fibrillation: Secondary | ICD-10-CM

## 2011-05-20 DIAGNOSIS — E785 Hyperlipidemia, unspecified: Secondary | ICD-10-CM

## 2011-05-20 DIAGNOSIS — I2510 Atherosclerotic heart disease of native coronary artery without angina pectoris: Secondary | ICD-10-CM

## 2011-05-20 DIAGNOSIS — I10 Essential (primary) hypertension: Secondary | ICD-10-CM

## 2011-05-20 MED ORDER — ISOSORBIDE MONONITRATE ER 30 MG PO TB24: 30.0000 mg | ORAL_TABLET | Freq: Every day | ORAL | Status: DC

## 2011-05-20 NOTE — Assessment & Plan Note (Signed)
Patient is receiving appropriate treatment for her lipids.

## 2011-05-20 NOTE — Assessment & Plan Note (Signed)
Atrial fibrillation is controlled. The patient has refused Coumadin in the past. She is on aspirin for her coronary disease. No further workup.

## 2011-05-20 NOTE — Progress Notes (Signed)
HPI Patient is seen back for followup atrial fibrillation and coronary disease. She has had some intermittent angina. For the first time she needed to take a second nitroglycerin last night. Her discomfort is random. It can occur at different times of the day and it can occur at rest or with exercise. There's been no nausea vomiting or diaphoresis with it. There is no shortness of breath.  No Known Allergies  Current Outpatient Prescriptions  Medication Sig Dispense Refill  . amLODipine (NORVASC) 5 MG tablet Take 1 tablet by mouth Daily.      Marland Kitchen aspirin 81 MG tablet Take 81 mg by mouth daily.        . benazepril (LOTENSIN) 40 MG tablet Take 1 tablet by mouth Daily.      . digoxin (LANOXIN) 0.25 MG tablet Take 250 mcg by mouth daily.        . dorzolamide-timolol (COSOPT) 22.3-6.8 MG/ML ophthalmic solution as directed.      . latanoprost (XALATAN) 0.005 % ophthalmic solution as directed.      . metoprolol (TOPROL-XL) 50 MG 24 hr tablet TAKE 1/2 TABLET BY MOUTH DAILY  15 tablet  5  . Multiple Vitamins-Minerals (ICAPS MV PO) Take 2 tablets by mouth daily.        . nitroGLYCERIN (NITROSTAT) 0.4 MG SL tablet Place 0.4 mg under the tongue every 5 (five) minutes as needed.        . pravastatin (PRAVACHOL) 40 MG tablet Take 40 mg by mouth daily.          History   Social History  . Marital Status: Married    Spouse Name: N/A    Number of Children: N/A  . Years of Education: N/A   Occupational History  . Not on file.   Social History Main Topics  . Smoking status: Former Games developer  . Smokeless tobacco: Not on file  . Alcohol Use: Yes  . Drug Use: Not on file  . Sexually Active: Not on file   Other Topics Concern  . Not on file   Social History Narrative  . No narrative on file    Family History  Problem Relation Age of Onset  . Heart attack Father     Past Medical History  Diagnosis Date  . Dyslipidemia   . HTN (hypertension)   . Iron deficiency anemia   . CAD (coronary artery  disease)     remote anterior MI with PTCA to LAD  /  DES to RCA February, 2005, 70% circumflex  /  nuclear, April, 2006, EF 75%, mild ischemia distal anterior wall  . Atrial fibrillation     refused Coumadin in the past., atrial fib chronic as of November, 2010  . History of umbilical hernia repair     No past surgical history on file.  ROS  Patient denies fever, chills, headache, sweats, rash, change in vision, change in hearing, cough, nausea vomiting, urinary symptoms. All other systems are reviewed and are negative.  PHYSICAL EXAM Patient is here with her son today. She is oriented to person time and place. Affect is normal. There is no jugulovenous distention. Lungs are clear. Respiratory effort is nonlabored. Cardiac exam reveals S1 and S2. There no clicks or significant murmurs. The abdomen is soft. There is no significant peripheral edema. There are no skin rashes. Filed Vitals:   05/20/11 1137  BP: 132/68  Height: 5\' 5"  (1.651 m)  Weight: 123 lb (55.792 kg)   EKG was done today and  reviewed by me. She has atrial fibrillation with a controlled rate. She has diffuse nonspecific ST-T wave changes. I have reviewed the prior EKG and there is no significant change.  ASSESSMENT & PLAN

## 2011-05-20 NOTE — Assessment & Plan Note (Signed)
Blood pressures control. No change in therapy. 

## 2011-05-20 NOTE — Assessment & Plan Note (Signed)
I am concerned the patient is having some symptoms. It is most appropriate to adjust her medications before proceeding with further aggressive evaluation. I will be adding Imdur and I will see her for followup. If she has an increasing pattern of pain we will proceed with more aggressive evaluation. She has had interventions in the past.

## 2011-05-20 NOTE — Patient Instructions (Signed)
Your physician recommends that you schedule a follow-up appointment in: 3-4 weeks  Your physician has recommended you make the following change in your medication: Start Imdur 30mg  daily

## 2011-06-10 ENCOUNTER — Ambulatory Visit: Payer: Medicare Other | Admitting: Cardiology

## 2011-06-11 ENCOUNTER — Telehealth: Payer: Self-pay | Admitting: Cardiology

## 2011-06-11 NOTE — Telephone Encounter (Signed)
Discussed with Dr Patty Sermons who agreed with plan.

## 2011-06-11 NOTE — Telephone Encounter (Signed)
Pt is calling because she is still having chest pain even after Dr Myrtis Ser started her on Imdur.  She doesn't feel the imdur has helped the pain at all.  She doesn't have an appt coming up with Dr Myrtis Ser soon.  She is requesting to be seen sooner.  She states the pain is still slight, denies nausea, denies sob and denies sweating.  Appt was scheduled with Tereso Newcomer PA-C on Tuesday.  I went over the nitro instructions with her including calling 911 if the pain was not relieved by nitro.  I also told her to call back if any symptoms or pain changed.

## 2011-06-13 ENCOUNTER — Ambulatory Visit: Payer: Medicare Other | Admitting: Cardiology

## 2011-06-17 ENCOUNTER — Ambulatory Visit: Payer: Medicare Other | Admitting: Physician Assistant

## 2011-07-25 ENCOUNTER — Encounter: Payer: Self-pay | Admitting: Cardiology

## 2011-07-25 ENCOUNTER — Ambulatory Visit (INDEPENDENT_AMBULATORY_CARE_PROVIDER_SITE_OTHER): Payer: Medicare Other | Admitting: Cardiology

## 2011-07-25 VITALS — BP 122/58 | HR 68 | Ht 65.0 in | Wt 121.0 lb

## 2011-07-25 DIAGNOSIS — I2510 Atherosclerotic heart disease of native coronary artery without angina pectoris: Secondary | ICD-10-CM

## 2011-07-25 DIAGNOSIS — I4891 Unspecified atrial fibrillation: Secondary | ICD-10-CM

## 2011-07-25 NOTE — Patient Instructions (Signed)
Your physician wants you to follow-up in:  6 months. You will receive a reminder letter in the mail two months in advance. If you don't receive a letter, please call our office to schedule the follow-up appointment.   

## 2011-07-25 NOTE — Assessment & Plan Note (Signed)
Atrial fibrillation rate is controlled. No change in therapy. She has refused Coumadin in the past.

## 2011-07-25 NOTE — Assessment & Plan Note (Signed)
Coronary disease is now stable with the adjustment in her medications. No further workup is needed.

## 2011-07-25 NOTE — Progress Notes (Signed)
HPI   Patient is seen for followup coronary disease. I had seen her May 20, 2011. She was having some increased chest discomfort. Imdur was added and she has done well. She's not having any recurrent symptoms.   No Known Allergies  Current Outpatient Prescriptions  Medication Sig Dispense Refill  . amLODipine (NORVASC) 5 MG tablet Take 1 tablet by mouth Daily.      Marland Kitchen aspirin 81 MG tablet Take 81 mg by mouth daily.        . benazepril (LOTENSIN) 40 MG tablet Take 1 tablet by mouth Daily.      . digoxin (LANOXIN) 0.25 MG tablet Take 250 mcg by mouth daily.        . dorzolamide-timolol (COSOPT) 22.3-6.8 MG/ML ophthalmic solution as directed.      . isosorbide mononitrate (IMDUR) 30 MG 24 hr tablet Take 1 tablet (30 mg total) by mouth daily.  30 tablet  11  . latanoprost (XALATAN) 0.005 % ophthalmic solution as directed.      . metoprolol succinate (TOPROL-XL) 50 MG 24 hr tablet       . Multiple Vitamins-Minerals (ICAPS MV PO) Take 2 tablets by mouth daily.        . nitroGLYCERIN (NITROSTAT) 0.4 MG SL tablet Place 0.4 mg under the tongue every 5 (five) minutes as needed.        . pravastatin (PRAVACHOL) 40 MG tablet Take 40 mg by mouth daily.        Marland Kitchen DISCONTD: metoprolol (TOPROL-XL) 50 MG 24 hr tablet TAKE 1/2 TABLET BY MOUTH DAILY  15 tablet  5    History   Social History  . Marital Status: Married    Spouse Name: N/A    Number of Children: N/A  . Years of Education: N/A   Occupational History  . Not on file.   Social History Main Topics  . Smoking status: Former Games developer  . Smokeless tobacco: Not on file  . Alcohol Use: Yes  . Drug Use: Not on file  . Sexually Active: Not on file   Other Topics Concern  . Not on file   Social History Narrative  . No narrative on file    Family History  Problem Relation Age of Onset  . Heart attack Father     Past Medical History  Diagnosis Date  . Dyslipidemia   . HTN (hypertension)   . Iron deficiency anemia   . CAD  (coronary artery disease)     remote anterior MI with PTCA to LAD  /  DES to RCA February, 2005, 70% circumflex  /  nuclear, April, 2006, EF 75%, mild ischemia distal anterior wall  . Atrial fibrillation     refused Coumadin in the past., atrial fib chronic as of November, 2010  . History of umbilical hernia repair     No past surgical history on file.  ROS   Patient denies fever, chills, headache, sweats, rash, change in vision, change in hearing, chest pain, cough, nausea vomiting, urinary symptoms. All other systems are reviewed and are negative.  PHYSICAL EXAM  Patient is here with her son. She is oriented to person time and place. Affect is normal. There is no jugular venous distention. She has kyphosis of the spine. Lungs are clear. Respiratory effort is nonlabored. Cardiac exam reveals S1 and S2. There no clicks or significant murmurs. The abdomen is soft. There is no peripheral edema.  Filed Vitals:   07/25/11 1359  BP: 122/58  Pulse: 68  Height: 5\' 5"  (1.651 m)  Weight: 121 lb (54.885 kg)     ASSESSMENT & PLAN

## 2011-08-07 ENCOUNTER — Telehealth: Payer: Self-pay | Admitting: Cardiology

## 2011-08-07 NOTE — Telephone Encounter (Signed)
Directions per Dr Myrtis Ser

## 2011-08-07 NOTE — Telephone Encounter (Signed)
Martha Price states she has been having chest pain since starting her isosorbide.  The pain is off and on (3 times since April).  1-2 nitro stops the pain.  The pain is dull and mid sternal.  She is also having pains when she lifts her arms (bil) x one week.  No sob.  No sweating.  The pain starts while at rest.

## 2011-08-07 NOTE — Telephone Encounter (Signed)
New msg Pt said she had started drug isosorbide. She said she has been having some chest pain since she has been taking this med that comes and goes. Please call

## 2011-08-07 NOTE — Telephone Encounter (Signed)
Will forward to Dr. Henrietta Hoover nurse.

## 2011-08-07 NOTE — Telephone Encounter (Signed)
Pt told to increase her isosorbide to 60mg  and call back on Monday to see if this helps.  She agreed.

## 2011-08-27 ENCOUNTER — Other Ambulatory Visit: Payer: Self-pay | Admitting: Cardiology

## 2011-08-28 ENCOUNTER — Telehealth: Payer: Self-pay | Admitting: Cardiology

## 2011-08-28 MED ORDER — ISOSORBIDE MONONITRATE ER 30 MG PO TB24: 60.0000 mg | ORAL_TABLET | Freq: Every day | ORAL | Status: DC

## 2011-08-28 NOTE — Telephone Encounter (Signed)
Pt needs isosorbide, was taking one a day now take two a day finished rx early and need new refill called to [facility], pt out has none for tomorrow

## 2011-08-30 ENCOUNTER — Telehealth: Payer: Self-pay | Admitting: Physician Assistant

## 2011-08-30 NOTE — Telephone Encounter (Signed)
Patient reports chest discomfort despite an up-titration in Imdur to 60mg  daily about two weeks ago. This regimen had been adequately controlling her angina until recently. She states that over the last two days, she has notice some chest mild chest discomfort prompting her to take NTG SL PRN for relief. Advised to continue NTG SL PRN. If this becomes more frequent, advised to increase to Imdur 90mg  (3 tabs) daily in the AM. She states BP has been 140/80. No diaphoresis, lightheadedness, palpitations, shortness of breath or decrease in activity level. Patient understood and agreed.    Jacqulyn Bath, PA-C 08/30/2011 11:39 AM

## 2012-01-19 ENCOUNTER — Ambulatory Visit (INDEPENDENT_AMBULATORY_CARE_PROVIDER_SITE_OTHER): Payer: Medicare Other | Admitting: Surgery

## 2012-01-19 ENCOUNTER — Encounter (INDEPENDENT_AMBULATORY_CARE_PROVIDER_SITE_OTHER): Payer: Self-pay | Admitting: Surgery

## 2012-01-19 VITALS — BP 152/64 | HR 68 | Temp 98.4°F | Resp 20 | Ht 65.0 in | Wt 120.4 lb

## 2012-01-19 DIAGNOSIS — K4090 Unilateral inguinal hernia, without obstruction or gangrene, not specified as recurrent: Secondary | ICD-10-CM | POA: Insufficient documentation

## 2012-01-19 NOTE — Progress Notes (Addendum)
Subjective:     Patient ID: Martha Price, female   DOB: January 24, 1922, 78 y.o.   MRN: [id number]  HPI  Martha Price  1921-10-16 [id number]  Patient Care Team: Gaspar Garbe, MD as PCP - General (Internal Medicine)  This patient is a 78 y.o.female who presents today for surgical evaluation at the request of Dr. Pablo Ledger Drinkard.   Reason for evaluation: Abdominal pain.  Hernia.  Elderly woman with some memory issues.  Has been having intermittent abdominal pains. One episode a month ago was very intense.  Son was worried she would have to be taken to the emergency room.  I followed up with her primary care doctor's office.  Minimal pain at that time.   Difficult to get a history from her.  She comes today with family.  Has had bulging in her right groin.  Had repair of a umbilical hernia a couple of years ago.  Had a CT scan which showed no bowel obstruction but noted right inguinal hernia with bowel within it.  She is sent to me to see if hernia could be contributing to her increasing constipation and abdominal discomfort.  She tends to be deferential to family.  Cannot walk that well but walks by herself, not bedridden.  Some bloating/nausea  Patient Active Problem List  Diagnosis  . ANEMIA, IRON DEFICIENCY  . Dyslipidemia  . HTN (hypertension)  . CAD (coronary artery disease)  . Atrial fibrillation  . Inguinal hernia, right  . Preop cardiovascular exam    Past Medical History  Diagnosis Date  . Dyslipidemia   . HTN (hypertension)   . Iron deficiency anemia   . CAD (coronary artery disease)     remote anterior MI with PTCA to LAD  /  DES to RCA February, 2005, 70% circumflex  /  nuclear, April, 2006, EF 75%, mild ischemia distal anterior wall  . Atrial fibrillation     refused Coumadin in the past., atrial fib chronic as of November, 2010  . History of umbilical hernia repair   . Hyperlipidemia   . Preop cardiovascular exam     Cardiac clearance for inguinal hernia  repair under general anesthesia October, 2013    Past Surgical History  Procedure Date  . Umbilical hernia repair 2011    Dr Abbey Chatters - w mesh  . Laparoscopic cholecystectomy 2002    History   Social History  . Marital Status: Married    Spouse Name: N/A    Number of Children: N/A  . Years of Education: N/A   Occupational History  . Not on file.   Social History Main Topics  . Smoking status: Former Games developer  . Smokeless tobacco: Not on file  . Alcohol Use: No  . Drug Use: No  . Sexually Active: Not on file   Other Topics Concern  . Not on file   Social History Narrative  . No narrative on file    Family History  Problem Relation Age of Onset  . Heart attack Father   . Cancer Sister     breast  . Cancer Maternal Aunt     breast    Current Outpatient Prescriptions  Medication Sig Dispense Refill  . amLODipine (NORVASC) 5 MG tablet Take 1 tablet by mouth Daily.      Marland Kitchen aspirin 81 MG tablet Take 81 mg by mouth daily.        . benazepril (LOTENSIN) 40 MG tablet Take 1 tablet by mouth Daily.      Marland Kitchen  digoxin (LANOXIN) 0.25 MG tablet 1/2 tab po qd      . dorzolamide-timolol (COSOPT) 22.3-6.8 MG/ML ophthalmic solution as directed.      . isosorbide mononitrate (IMDUR) 30 MG 24 hr tablet Take 2 tablets (60 mg total) by mouth daily.  60 tablet  5  . latanoprost (XALATAN) 0.005 % ophthalmic solution as directed.      . metoprolol succinate (TOPROL-XL) 50 MG 24 hr tablet TAKE 1/2 TABLET BY MOUTH DAILY  15 tablet  5  . Multiple Vitamins-Minerals (ICAPS MV PO) Take 2 tablets by mouth daily.        . nitroGLYCERIN (NITROSTAT) 0.4 MG SL tablet Place 0.4 mg under the tongue every 5 (five) minutes as needed.        . pravastatin (PRAVACHOL) 40 MG tablet Take 40 mg by mouth daily.           No Known Allergies  BP 152/64  Pulse 68  Temp 98.4 F (36.9 C) (Temporal)  Resp 20  Ht 5\' 5"  (1.651 m)  Wt 120 lb 6.4 oz (54.613 kg)  BMI 20.04 kg/m2  No results  found.    Review of Systems  Constitutional: Negative for fever, chills, diaphoresis, activity change, appetite change, fatigue and unexpected weight change.  HENT: Positive for hearing loss. Negative for ear pain, sore throat, trouble swallowing, neck pain and ear discharge.   Eyes: Negative for photophobia, discharge and visual disturbance.  Respiratory: Negative for cough, choking, chest tightness and shortness of breath.   Cardiovascular: Negative for chest pain and palpitations.  Gastrointestinal: Positive for abdominal pain, diarrhea, constipation and abdominal distention. Negative for nausea, vomiting, anal bleeding and rectal pain.  Genitourinary: Negative for dysuria, frequency and difficulty urinating.  Musculoskeletal: Positive for myalgias, back pain and arthralgias. Negative for gait problem.  Skin: Negative for color change, pallor and rash.  Neurological: Negative for dizziness, speech difficulty, weakness and numbness.  Hematological: Negative for adenopathy.  Psychiatric/Behavioral: Negative for confusion and agitation. The patient is not nervous/anxious.        Objective:   Physical Exam  Constitutional: She is oriented to person, place, and time. She appears well-developed and well-nourished. She is active and cooperative.  Non-toxic appearance. She does not have a sickly appearance. She does not appear ill. No distress.       Thin  HENT:  Head: Normocephalic.  Mouth/Throat: Oropharynx is clear and moist. No oropharyngeal exudate.  Eyes: Conjunctivae normal and EOM are normal. Pupils are equal, round, and reactive to light. No scleral icterus.  Neck: Normal range of motion. Neck supple. No tracheal deviation present.  Cardiovascular: Normal rate, regular rhythm and intact distal pulses.   Pulmonary/Chest: Effort normal and breath sounds normal. No respiratory distress. She exhibits no tenderness.  Abdominal: Soft. She exhibits no distension and no mass. There is no  tenderness. Hernia confirmed negative in the right inguinal area and confirmed negative in the left inguinal area.    Genitourinary: No vaginal discharge found.  Musculoskeletal: Normal range of motion. She exhibits no tenderness.       Some kyphosis.  Thin body habitus.  Bony  Lymphadenopathy:    She has no cervical adenopathy.       Right: No inguinal adenopathy present.       Left: No inguinal adenopathy present.  Neurological: She is alert and oriented to person, place, and time. No cranial nerve deficit. She exhibits normal muscle tone. Coordination normal.  Skin: Skin is warm and dry. No  rash noted. She is not diaphoretic. No erythema.  Psychiatric: She has a normal mood and affect. Her behavior is normal. Judgment and thought content normal.       Can be feisty.  Memory is poor of many events.  Tends to defer questions/decisions to her son whom is present in the room       Assessment:     Severe abdominal pain of uncertain etiology.  No evidence of recurrent umbilical hernia on exam nor on CT scan to my view.  Chronic Right Inguinal hernia.  Larger now with small bowel within it.  Paraesophageal hiatal hernia.  No strong evidence of incarceration nor strangulation at this time    Plan:     At this point, because there is small bowel within the right inguinal hernia, I think she would benefit from surgery to repair this.  Groin hernia is at the highest risk of incarceration/strangulation.  Had obstructive like symptoms with bowel within the hernia = higher risk of problems.  Elective repair would be much less morbid than an emergent repair.   It would be reasonable to start out laparoscopically to make sure there is no evidence of any recurrent umbilical ventral hernia defects.  Plan laparoscopic repair of right groin (TAPP vs modified TEP if can get into cutdown easily.  Conversion to open if difficult / many adhesions.  While she is 50, she has pretty good independence and  functional quality of life.  I would want cardiac clearance first.  I stressed I would not want to put her at risk if cardiology feels that she would not tolerate surgery  The anatomy & physiology of the abdominal wall and pelvic floor was discussed.  The pathophysiology of hernias in the inguinal and pelvic region was discussed.  Natural history risks such as progressive enlargement, pain, incarceration & strangulation was discussed.   Contributors to complications such as smoking, obesity, diabetes, prior surgery, etc were discussed.    I feel the risks of no intervention will lead to serious problems that outweigh the operative risks; therefore, I recommended surgery to reduce and repair the hernia.  I explained laparoscopic techniques with possible need for an open approach.  I noted usual use of mesh to patch and/or buttress hernia repair  Risks such as bleeding, infection, abscess, need for further treatment, heart attack, death, and other risks were discussed.  I noted a good likelihood this will help address the problem.   Goals of post-operative recovery were discussed as well.  Possibility that this will not correct all symptoms was explained.  I stressed the importance of low-impact activity, aggressive pain control, avoiding constipation, & not pushing through pain to minimize risk of post-operative chronic pain or injury. Possibility of reherniation was discussed.  We will work to minimize complications.     An educational handout further explaining the pathology & treatment options was given as well.  Questions were answered.  The patient & son expresses understanding & wishes to proceed with surgery.    She does have a paraesophageal hiatal hernia.  Given her advanced age and without any major symptoms of dysphagia, reflux or intractable nausea and vomiting, I would hold off on doing repair of this as it would raise morbidity and other issues unless there is concern by her primary care  physician that that is the true source of her problem.

## 2012-01-19 NOTE — Patient Instructions (Addendum)
See the Handout(s) we gave you.  Consider surgery.  Please call our office at [phone] if you wish to schedule surgery or if you have further questions / concerns.    Inguinal Hernia, Adult Muscles help keep everything in the body in its proper place. But if a weak spot in the muscles develops, something can poke through. That is called a hernia. When this happens in the lower part of the belly (abdomen), it is called an inguinal hernia. (It takes its name from a part of the body in this region called the inguinal canal.) A weak spot in the wall of muscles lets some fat or part of the small intestine bulge through. An inguinal hernia can develop at any age. Men get them more often than women. CAUSES  In adults, an inguinal hernia develops over time.  It can be triggered by:  Suddenly straining the muscles of the lower abdomen.  Lifting heavy objects.  Straining to have a bowel movement. Difficult bowel movements (constipation) can lead to this.  Constant coughing. This may be caused by smoking or lung disease.  Being overweight.  Being pregnant.  Working at a job that requires long periods of standing or heavy lifting.  Having had an inguinal hernia before. One type can be an emergency situation. It is called a strangulated inguinal hernia. It develops if part of the small intestine slips through the weak spot and cannot get back into the abdomen. The blood supply can be cut off. If that happens, part of the intestine may die. This situation requires emergency surgery. SYMPTOMS  Often, a small inguinal hernia has no symptoms. It is found when a healthcare provider does a physical exam. Larger hernias usually have symptoms.   In adults, symptoms may include:  A lump in the groin. This is easier to see when the person is standing. It might disappear when lying down.  In men, a lump in the scrotum.  Pain or burning in the groin. This occurs especially when lifting, straining  or coughing.  A dull ache or feeling of pressure in the groin.  Signs of a strangulated hernia can include:  A bulge in the groin that becomes very painful and tender to the touch.  A bulge that turns red or purple.  Fever, nausea and vomiting.  Inability to have a bowel movement or to pass gas. DIAGNOSIS  To decide if you have an inguinal hernia, a healthcare provider will probably do a physical examination.  This will include asking questions about any symptoms you have noticed.  The healthcare provider might feel the groin area and ask you to cough. If an inguinal hernia is felt, the healthcare provider may try to slide it back into the abdomen.  Usually no other tests are needed. TREATMENT  Treatments can vary. The size of the hernia makes a difference. Options include:  Watchful waiting. This is often suggested if the hernia is small and you have had no symptoms.  No medical procedure will be done unless symptoms develop.  You will need to watch closely for symptoms. If any occur, contact your healthcare provider right away.  Surgery. This is used if the hernia is larger or you have symptoms.  Open surgery. This is usually an outpatient procedure (you will not stay overnight in a hospital). An cut (incision) is made through the skin in the groin. The hernia is put back inside the abdomen. The weak area in the muscles is then repaired by   herniorrhaphy or hernioplasty. Herniorrhaphy: in this type of surgery, the weak muscles are sewn back together. Hernioplasty: a patch or mesh is used to close the weak area in the abdominal wall.  Laparoscopy. In this procedure, a surgeon makes small incisions. A thin tube with a tiny video camera (called a laparoscope) is put into the abdomen. The surgeon repairs the hernia with mesh by looking with the video camera and using two long instruments. HOME CARE INSTRUCTIONS   After surgery to repair an inguinal hernia:  You will need to take  pain medicine prescribed by your healthcare provider. Follow all directions carefully.  You will need to take care of the wound from the incision.  Your activity will be restricted for awhile. This will probably include no heavy lifting for several weeks. You also should not do anything too active for a few weeks. When you can return to work will depend on the type of job that you have.  During "watchful waiting" periods, you should:  Maintain a healthy weight.  Eat a diet high in fiber (fruits, vegetables and whole grains).  Drink plenty of fluids to avoid constipation. This means drinking enough water and other liquids to keep your urine clear or pale yellow.  Do not lift heavy objects.  Do not stand for long periods of time.  Quit smoking. This should keep you from developing a frequent cough. SEEK MEDICAL CARE IF:   A bulge develops in your groin area.  You feel pain, a burning sensation or pressure in the groin. This might be worse if you are lifting or straining.  You develop a fever of more than 100.5 F (38.1 C). SEEK IMMEDIATE MEDICAL CARE IF:   Pain in the groin increases suddenly.  A bulge in the groin gets bigger suddenly and does not go down.  For men, there is sudden pain in the scrotum. Or, the size of the scrotum increases.  A bulge in the groin area becomes red or purple and is painful to touch.  You have nausea or vomiting that does not go away.  You feel your heart beating much faster than normal.  You cannot have a bowel movement or pass gas.  You develop a fever of more than 102.0 F (38.9 C). Document Released: 08/03/2008 Document Revised: 06/09/2011 Document Reviewed: 08/03/2008 Exit[employer] Patient Information 2013 [employer].  

## 2012-01-20 ENCOUNTER — Encounter (INDEPENDENT_AMBULATORY_CARE_PROVIDER_SITE_OTHER): Payer: Self-pay

## 2012-01-20 ENCOUNTER — Encounter: Payer: Self-pay | Admitting: Cardiology

## 2012-01-20 ENCOUNTER — Ambulatory Visit (INDEPENDENT_AMBULATORY_CARE_PROVIDER_SITE_OTHER): Payer: Medicare Other | Admitting: Cardiology

## 2012-01-20 VITALS — BP 140/76 | HR 69 | Wt 123.0 lb

## 2012-01-20 DIAGNOSIS — Z01810 Encounter for preprocedural cardiovascular examination: Secondary | ICD-10-CM

## 2012-01-20 DIAGNOSIS — I4891 Unspecified atrial fibrillation: Secondary | ICD-10-CM

## 2012-01-20 DIAGNOSIS — I2510 Atherosclerotic heart disease of native coronary artery without angina pectoris: Secondary | ICD-10-CM

## 2012-01-20 DIAGNOSIS — I10 Essential (primary) hypertension: Secondary | ICD-10-CM

## 2012-01-20 NOTE — Assessment & Plan Note (Signed)
The patient has an inguinal hernia. By report there is small intestine within the hernia. She is being considered for surgery. She is stable. I am not able to calculate her exercise level. We do need to proceed with a nuclear stress study. I will review this in and be in touch with her surgeon. If this study does not show any marked abnormalities, she will be cleared for laparoscopic hernia repair under general anesthesia.

## 2012-01-20 NOTE — Progress Notes (Signed)
HPI  The patient is seen to followup coronary disease and atrial fibrillation. She's also seen for preop clearance for inguinal hernia repair under general anesthesia.  The patient does have coronary disease. She had a remote PTCA to the LAD. She had a drug-eluting stent to the right coronary artery in 2005. There was a 70% residual circumflex lesion. Nuclear study in April, 2006 revealed normal ejection fraction. There was question of mild ischemia of the distal anterior wall.  Early in 2013 the patient had some slight increased chest discomfort. I adjusted her medicines with increasing in duration she has felt fine since then. She's not had any pain pattern change. She's not having any significant shortness of breath. She does have some difficulties with her legs and knees. This limits her ambulation. Therefore I cannot assess overall her exercise capacity.  No Known Allergies  Current Outpatient Prescriptions  Medication Sig Dispense Refill  . amLODipine (NORVASC) 5 MG tablet Take 1 tablet by mouth Daily.      Marland Kitchen aspirin 81 MG tablet Take 81 mg by mouth daily.        . benazepril (LOTENSIN) 40 MG tablet Take 1 tablet by mouth Daily.      . digoxin (LANOXIN) 0.25 MG tablet 1/2 tab po qd      . dorzolamide-timolol (COSOPT) 22.3-6.8 MG/ML ophthalmic solution as directed.      . isosorbide mononitrate (IMDUR) 30 MG 24 hr tablet Take 2 tablets (60 mg total) by mouth daily.  60 tablet  5  . latanoprost (XALATAN) 0.005 % ophthalmic solution as directed.      . metoprolol succinate (TOPROL-XL) 50 MG 24 hr tablet TAKE 1/2 TABLET BY MOUTH DAILY  15 tablet  5  . Multiple Vitamins-Minerals (ICAPS MV PO) Take 2 tablets by mouth daily.        . nitroGLYCERIN (NITROSTAT) 0.4 MG SL tablet Place 0.4 mg under the tongue every 5 (five) minutes as needed.        . pravastatin (PRAVACHOL) 40 MG tablet Take 40 mg by mouth daily.        Marland Kitchen DISCONTD: metoprolol succinate (TOPROL-XL) 50 MG 24 hr tablet          History   Social History  . Marital Status: Married    Spouse Name: N/A    Number of Children: N/A  . Years of Education: N/A   Occupational History  . Not on file.   Social History Main Topics  . Smoking status: Former Games developer  . Smokeless tobacco: Not on file  . Alcohol Use: No  . Drug Use: No  . Sexually Active: Not on file   Other Topics Concern  . Not on file   Social History Narrative  . No narrative on file    Family History  Problem Relation Age of Onset  . Heart attack Father   . Cancer Sister     breast  . Cancer Maternal Aunt     breast    Past Medical History  Diagnosis Date  . Dyslipidemia   . HTN (hypertension)   . Iron deficiency anemia   . CAD (coronary artery disease)     remote anterior MI with PTCA to LAD  /  DES to RCA February, 2005, 70% circumflex  /  nuclear, April, 2006, EF 75%, mild ischemia distal anterior wall  . Atrial fibrillation     refused Coumadin in the past., atrial fib chronic as of November, 2010  . History of  umbilical hernia repair   . Hyperlipidemia   . Preop cardiovascular exam     Cardiac clearance for inguinal hernia repair under general anesthesia October, 2013    Past Surgical History  Procedure Date  . Umbilical hernia repair 2011    Dr Abbey Chatters - w mesh  . Laparoscopic cholecystectomy 2002    Patient Active Problem List  Diagnosis  . ANEMIA, IRON DEFICIENCY  . Dyslipidemia  . HTN (hypertension)  . CAD (coronary artery disease)  . Atrial fibrillation  . Inguinal hernia, right  . Preop cardiovascular exam    ROS   Patient denies fever, chills, headache, sweats, rash, change in vision, change in hearing, chest pain, cough, nausea vomiting, urinary symptoms. All other systems are reviewed and are negative.  PHYSICAL EXAM  Patient is oriented to person time and place. Affect is normal. She is quite stable despite her age of 46 years. She's here with her family member. She has kyphosis of the  thoracic spine. There is no jugulovenous distention. Lungs are clear. Respiratory effort is nonlabored. Cardiac exam reveals S1 and S2. The rhythm is irregularly irregular. The abdomen is soft. There is a soft systolic murmur. There is no peripheral edema. There no musculoskeletal deformities other than her spine. There no skin rashes.  Filed Vitals:   01/20/12 1353  BP: 140/76  Pulse: 69  Weight: 123 lb (55.792 kg)   EKG is done today and reviewed by me. There is atrial fibrillation with controlled rate.  ASSESSMENT & PLAN

## 2012-01-20 NOTE — Assessment & Plan Note (Signed)
Blood pressure is controlled. No change in therapy. 

## 2012-01-20 NOTE — Patient Instructions (Addendum)
Your physician has requested that you have a lexiscan myoview. For further information please visit [email or web address]. Please follow instruction sheet, as given.  Your physician wants you to follow-up in: 6 months.  You will receive a reminder letter in the mail two months in advance. If you don't receive a letter, please call our office to schedule the follow-up appointment.  

## 2012-01-20 NOTE — Assessment & Plan Note (Signed)
The patient has chronic atrial fibrillation. She has refused Coumadin in the past. Her rate is controlled.

## 2012-01-20 NOTE — Assessment & Plan Note (Signed)
Coronary disease is stable. Her last intervention was 2005. Her last nuclear study was 2006. Ejection fraction was normal. There was question of mild distal anterior ischemia. She has been doing well overall. However she now needs to be cleared for surgery with possible general anesthesia. I feel that we should proceed with nuclear exercise testing. If she has mild abnormality she can proceed with surgery. If she has a marked abnormality we will need to take a step back and review the overall situation.

## 2012-02-04 ENCOUNTER — Ambulatory Visit (HOSPITAL_COMMUNITY): Payer: Medicare Other | Attending: Cardiovascular Disease | Admitting: Radiology

## 2012-02-04 VITALS — BP 132/55 | HR 65 | Ht 63.5 in | Wt 120.0 lb

## 2012-02-04 DIAGNOSIS — Z01810 Encounter for preprocedural cardiovascular examination: Secondary | ICD-10-CM

## 2012-02-04 DIAGNOSIS — Z8249 Family history of ischemic heart disease and other diseases of the circulatory system: Secondary | ICD-10-CM | POA: Insufficient documentation

## 2012-02-04 DIAGNOSIS — I4891 Unspecified atrial fibrillation: Secondary | ICD-10-CM | POA: Insufficient documentation

## 2012-02-04 DIAGNOSIS — I10 Essential (primary) hypertension: Secondary | ICD-10-CM | POA: Insufficient documentation

## 2012-02-04 DIAGNOSIS — I2510 Atherosclerotic heart disease of native coronary artery without angina pectoris: Secondary | ICD-10-CM

## 2012-02-04 DIAGNOSIS — E785 Hyperlipidemia, unspecified: Secondary | ICD-10-CM

## 2012-02-04 DIAGNOSIS — M79609 Pain in unspecified limb: Secondary | ICD-10-CM | POA: Insufficient documentation

## 2012-02-04 MED ORDER — REGADENOSON 0.4 MG/5ML IV SOLN
0.4000 mg | Freq: Once | INTRAVENOUS | Status: AC
  Administered 2012-02-04: 0.4 mg via INTRAVENOUS

## 2012-02-04 MED ORDER — TECHNETIUM TC 99M SESTAMIBI GENERIC - CARDIOLITE
33.0000 | Freq: Once | INTRAVENOUS | Status: AC | PRN
  Administered 2012-02-04: 33 via INTRAVENOUS

## 2012-02-04 MED ORDER — TECHNETIUM TC 99M SESTAMIBI GENERIC - CARDIOLITE
10.8000 | Freq: Once | INTRAVENOUS | Status: AC | PRN
  Administered 2012-02-04: 11 via INTRAVENOUS

## 2012-02-04 NOTE — Progress Notes (Signed)
[facility] SITE 3 NUCLEAR MED [address] [id number] [place] [address] [phone]  Cardiology Nuclear Med Study  Martha Price is a 78 y.o. female     MRN : [id number]     DOB: Aug 24, 1921  Procedure Date: 02/04/2012  Nuclear Med Background Indication for Stress Test:  Evaluation for Ischemia, PTCA Patency and Pending Clearance for Hernia Repair History:  '00 MI>PTCA-LAD; '05 Stent-RCA; '06 GEX:BMWU distal anterior wall ischemia, EF=75%; h/o Atrial Fibrillations Cardiac Risk Factors: Family History - CAD, History of Smoking (per patient),  Hypertension and Lipids  Symptoms:  No cardiac complaints, c/o bilateral leg pain.   Nuclear Pre-Procedure Caffeine/Decaff Intake:  None NPO After: 7:00pm   Lungs:  Clear. O2 Sat: 98% on room air. IV 0.9% NS with Angio Cath:  22g  IV Site: R Hand  IV Started by:  Bonnita Levan, RN  Chest Size (in):  36 Cup Size: B  Height: 5' 3.5" (1.613 m)  Weight:  120 lb (54.432 kg)  BMI:  Body mass index is 20.92 kg/(m^2). Tech Comments:  AM medications taken.    Nuclear Med Study 1 or 2 day study: 1 day  Stress Test Type:  Lexiscan  Reading MD: Charlton Haws, MD  Order Authorizing Provider:  Willa Rough, MD  Resting Radionuclide: Technetium 69m Sestamibi  Resting Radionuclide Dose: 11.0 mCi   Stress Radionuclide:  Technetium 96m Sestamibi  Stress Radionuclide Dose: 33.0 mCi           Stress Protocol Rest HR: 65 Stress HR: 81  Rest BP: 132/55 Stress BP: 130/58  Exercise Time (min): n/a METS: n/a   Predicted Max HR: 132 bpm % Max HR: 61.36 bpm Rate Pressure Product: 13244   Dose of Adenosine (mg):  n/a Dose of Lexiscan: 0.4 mg  Dose of Atropine (mg): n/a Dose of Dobutamine: n/a mcg/kg/min (at max HR)  Stress Test Technologist: Smiley Houseman, CMA-N  Nuclear Technologist:  Domenic Polite, CNMT     Rest Procedure:  Myocardial perfusion imaging was performed at rest 45 minutes following the intravenous  administration of Technetium 32m Sestamibi.  Rest ECG: Atrial Fibrillation with nonspecific T-wave changes.  Stress Procedure:  The patient received IV Lexiscan 0.4 mg over 15-seconds.  Technetium 37m Sestamibi was injected at 30-seconds.  There were no significant changes with Lexiscan.  Quantitative spect images were obtained after a 45 minute delay.  Stress ECG: No significant change from baseline ECG  QPS Raw Data Images:  Normal; no motion artifact; normal heart/lung ratio. Stress Images:  There is decreased uptake in the anterior wall. Rest Images:  There is decreased uptake in the anterior wall. Subtraction (SDS):  There is a fixed defect that is most consistent with a previous infarction. Transient Ischemic Dilatation (Normal <1.22):  1.19 Lung/Heart Ratio (Normal <0.45):  0.27  Quantitative Gated Spect Images QGS EDV:  n/a QGS ESV:  n/a  Impression Exercise Capacity:  Lexiscan with no exercise. BP Response:  Normal blood pressure response. Clinical Symptoms:  No significant symptoms noted. ECG Impression:  No significant ST segment change suggestive of ischemia. Comparison with Prior Nuclear Study: No images to compare  Overall Impression:  Low risk stress nuclear study. Small distal anteroapical infarct no ischemia No gated EF due to EF  LV Ejection Fraction: Study not gated.  LV Wall Motion:  Study not gated   [employer]

## 2012-02-11 ENCOUNTER — Other Ambulatory Visit (INDEPENDENT_AMBULATORY_CARE_PROVIDER_SITE_OTHER): Payer: Self-pay | Admitting: Surgery

## 2012-02-24 ENCOUNTER — Other Ambulatory Visit: Payer: Self-pay | Admitting: *Deleted

## 2012-02-24 MED ORDER — ISOSORBIDE MONONITRATE ER 30 MG PO TB24: 60.0000 mg | ORAL_TABLET | Freq: Every day | ORAL | Status: DC

## 2012-02-25 ENCOUNTER — Other Ambulatory Visit (HOSPITAL_COMMUNITY): Payer: Self-pay | Admitting: Otolaryngology

## 2012-03-04 ENCOUNTER — Encounter (HOSPITAL_COMMUNITY): Payer: Self-pay | Admitting: Pharmacy Technician

## 2012-03-05 ENCOUNTER — Encounter (HOSPITAL_COMMUNITY)
Admission: RE | Admit: 2012-03-05 | Discharge: 2012-03-05 | Disposition: A | Discharge status: Home / Self Care | Payer: Medicare Other | Source: Ambulatory Visit | Attending: Otolaryngology | Admitting: Otolaryngology

## 2012-03-05 ENCOUNTER — Encounter (HOSPITAL_COMMUNITY): Payer: Self-pay

## 2012-03-05 HISTORY — DX: Gastro-esophageal reflux disease without esophagitis: K21.9

## 2012-03-05 LAB — CBC
HCT: 39.6 % (ref 36.0–46.0)
Hemoglobin: 13.1 g/dL (ref 12.0–15.0)
MCHC: 33.1 g/dL (ref 30.0–36.0)
WBC: 5.9 10*3/uL (ref 4.0–10.5)

## 2012-03-05 LAB — BASIC METABOLIC PANEL
Chloride: 102 mEq/L (ref 96–112)
GFR calc Af Amer: >90 mL/min (ref >90)
GFR calc non Af Amer: 78 mL/min — ABNORMAL LOW (ref >90)
Potassium: 4 mEq/L (ref 3.5–5.1)
Sodium: 137 mEq/L (ref 135–145)

## 2012-03-05 LAB — SURGICAL PCR SCREEN
MRSA, PCR: NEGATIVE
Staphylococcus aureus: NEGATIVE

## 2012-03-05 NOTE — Pre-Procedure Instructions (Signed)
[address] Martha Price  03/05/2012   Your procedure is scheduled on:  Thursday March 11, 2012 at 0900 AM  Report to [facility] at 0700 AM.  Call this number if you have problems the morning of surgery: [phone]   Remember:   Do not eat food or drink:After Midnight.Wednesday     Take these medicines the morning of surgery with A SIP OF WATER: Amlodipine, Digoxin, Isosorbide mononitrate,and Metoprpolol,   Do not wear jewelry, make-up or nail polish.  Do not wear lotions, powders, or perfumes. You may wear deodorant.  Do not shave 48 hours prior to surgery.   Do not bring valuables to the hospital.  Contacts, dentures or bridgework may not be worn into surgery.  Leave suitcase in the car. After surgery it may be brought to your room.  For patients admitted to the hospital, checkout time is 11:00 AM the day of discharge.   Patients discharged the day of surgery will not be allowed to drive home.  Name and phone number of your driver: Son Virl Diamond  Special Instructions: Shower using CHG 2 nights before surgery and the night before surgery.  If you shower the day of surgery use CHG.  Use special wash - you have one bottle of CHG for all showers.  You should use approximately 1/3 of the bottle for each shower.   Please read over the following fact sheets that you were given: Pain Booklet, Coughing and Deep Breathing, MRSA Information and Surgical Site Infection Prevention

## 2012-03-08 ENCOUNTER — Telehealth: Payer: Self-pay | Admitting: Cardiology

## 2012-03-08 MED ORDER — METOPROLOL SUCCINATE ER 50 MG PO TB24: 25.0000 mg | ORAL_TABLET | Freq: Every day | ORAL | Status: DC

## 2012-03-08 NOTE — Telephone Encounter (Signed)
Pt needs metoprolol said [facility] request last week, pt now out needs asap at [facility]

## 2012-03-08 NOTE — Consult Note (Signed)
Anesthesia chart review: Patient is an 78 year old female scheduled for removal of right nasal passage mass by Dr. Jenne Pane on 03/11/2012. History includes former smoker, hypertension, CAD s/p PTCA LAD '00, DES '05, atrial fibrillation (patient has refused Coumadin in the past), hyperlipidemia, GERD, umbilical hernia repair, right inguinal hernia, cholecystectomy. PCP is listed as Dr. Guerry Bruin.  Cardiologist is Dr. Myrtis Ser.  He saw her on 01/20/12 for follow-up and preoperative clearance for a right inguinal hernia repair--which she hasn't had yet.  He ordered a nuclear stress test which was done on 02/04/12 and showed: Low risk stress nuclear study. Small distal anteroapical infarct no ischemia. LV Ejection Fraction: Study not gated. LV Wall Motion: Study not gated.   He subsequently cleared her for the hernia repair.  Since then she saw Dr. Jenne Pane because she couldn't breath well from her right nares which prompted a biopsy and now further nasal surgery.  Patient stopped her ASA this weekend in preparation for surgery.  EKG on 01/20/2012 showed atrial fibrillation, rightward axis, nonspecific T wave abnormality.  Cardiac cath on 05/23/03 showed: 1. Coronary artery disease status post remote anterior wall infarction treated with PTCA of the LAD with 50% narrowing in the mid LAD, 50% narrowing in the first diagonal branch to the LAD, 70% narrowing in the proximal circumflex artery and 95% stenosis in the mid right coronary artery and 50% and 40% stenoses in the posterior descending branch of the  right coronary artery with normal LV function.  2. Successful stenting of the lesion in the mid right coronary artery with a Cypher stent with improvement in percent diameter narrowing from 95% to 0%.  Chest x-ray on 03/05/2012 showed: 1. Cardiomegaly without failure.  2. Pulmonary arterial hypertension. New contour abnormality along the left heart border is most compatible with enlargement main pulmonary  artery in conjunction with projection.  3. Emphysema.  Preoperative labs noted.  I reviewed her CXR and history with Anesthesiologist Dr. Noreene Larsson.  She will be evaluated by her assigned anesthesiologist on the day of surgery, but if no significant changes then anticipate she can proceed as planned.  Shonna Chock, PA-C 03/08/12 1700

## 2012-03-10 MED ORDER — CEFAZOLIN SODIUM-DEXTROSE 2-3 GM-% IV SOLR
2.0000 g | INTRAVENOUS | Status: AC
  Administered 2012-03-11: 2 g via INTRAVENOUS
  Filled 2012-03-10: qty 50

## 2012-03-11 ENCOUNTER — Encounter (HOSPITAL_COMMUNITY): Payer: Self-pay | Admitting: *Deleted

## 2012-03-11 ENCOUNTER — Ambulatory Visit (HOSPITAL_COMMUNITY): Payer: Medicare Other | Admitting: Vascular Surgery

## 2012-03-11 ENCOUNTER — Ambulatory Visit (HOSPITAL_COMMUNITY)
Admission: RE | Admit: 2012-03-11 | Discharge: 2012-03-11 | Disposition: A | Discharge status: Home / Self Care | Payer: Medicare Other | Source: Ambulatory Visit | Attending: Otolaryngology | Admitting: Otolaryngology

## 2012-03-11 ENCOUNTER — Encounter (HOSPITAL_COMMUNITY): Payer: Self-pay | Admitting: Vascular Surgery

## 2012-03-11 ENCOUNTER — Encounter (HOSPITAL_COMMUNITY)
Admission: RE | Disposition: A | Discharge status: Home / Self Care | Payer: Self-pay | Source: Ambulatory Visit | Attending: Otolaryngology

## 2012-03-11 DIAGNOSIS — Z79899 Other long term (current) drug therapy: Secondary | ICD-10-CM | POA: Insufficient documentation

## 2012-03-11 DIAGNOSIS — Z01818 Encounter for other preprocedural examination: Secondary | ICD-10-CM | POA: Insufficient documentation

## 2012-03-11 DIAGNOSIS — J3489 Other specified disorders of nose and nasal sinuses: Secondary | ICD-10-CM

## 2012-03-11 DIAGNOSIS — Z01812 Encounter for preprocedural laboratory examination: Secondary | ICD-10-CM | POA: Insufficient documentation

## 2012-03-11 DIAGNOSIS — I10 Essential (primary) hypertension: Secondary | ICD-10-CM | POA: Insufficient documentation

## 2012-03-11 DIAGNOSIS — J329 Chronic sinusitis, unspecified: Secondary | ICD-10-CM | POA: Insufficient documentation

## 2012-03-11 HISTORY — PX: NASAL SINUS SURGERY: SHX719

## 2012-03-11 SURGERY — SINUS SURGERY, ENDOSCOPIC
Anesthesia: General | Site: Nose | Laterality: Bilateral | Wound class: Clean Contaminated

## 2012-03-11 MED ORDER — LIDOCAINE-EPINEPHRINE 1 %-1:100000 IJ SOLN
INTRAMUSCULAR | Status: DC | PRN
  Administered 2012-03-11: 5 mL

## 2012-03-11 MED ORDER — OXYMETAZOLINE HCL 0.05 % NA SOLN
NASAL | Status: DC | PRN
  Administered 2012-03-11: 1 via NASAL

## 2012-03-11 MED ORDER — OXYCODONE HCL 5 MG/5ML PO SOLN: 5.0000 mg | Freq: Once | ORAL | Status: DC | PRN

## 2012-03-11 MED ORDER — MIDAZOLAM HCL 5 MG/5ML IJ SOLN
INTRAMUSCULAR | Status: DC | PRN
  Administered 2012-03-11: 0.5 mg via INTRAVENOUS

## 2012-03-11 MED ORDER — LIDOCAINE-EPINEPHRINE 1 %-1:100000 IJ SOLN
INTRAMUSCULAR | Status: AC
  Filled 2012-03-11: qty 1

## 2012-03-11 MED ORDER — ONDANSETRON HCL 4 MG/2ML IJ SOLN
INTRAMUSCULAR | Status: DC | PRN
  Administered 2012-03-11: 4 mg via INTRAVENOUS

## 2012-03-11 MED ORDER — ONDANSETRON HCL 4 MG/2ML IJ SOLN: 4.0000 mg | Freq: Four times a day (QID) | INTRAMUSCULAR | Status: DC | PRN

## 2012-03-11 MED ORDER — CEPHALEXIN 500 MG PO CAPS: 500.0000 mg | ORAL_CAPSULE | Freq: Three times a day (TID) | ORAL | Status: DC

## 2012-03-11 MED ORDER — DEXTROSE 5 % IV SOLN
INTRAVENOUS | Status: DC | PRN
  Administered 2012-03-11: 10:00:00 via INTRAVENOUS

## 2012-03-11 MED ORDER — FENTANYL CITRATE 0.05 MG/ML IJ SOLN
25.0000 ug | INTRAMUSCULAR | Status: DC | PRN
  Administered 2012-03-11 (×2): 25 ug via INTRAVENOUS

## 2012-03-11 MED ORDER — FENTANYL CITRATE 0.05 MG/ML IJ SOLN
INTRAMUSCULAR | Status: AC
  Filled 2012-03-11: qty 2

## 2012-03-11 MED ORDER — SODIUM CHLORIDE 0.9 % IR SOLN
Status: DC | PRN
  Administered 2012-03-11 (×2): 1000 mL

## 2012-03-11 MED ORDER — LACTATED RINGERS IV SOLN
INTRAVENOUS | Status: DC
  Administered 2012-03-11: 08:00:00 via INTRAVENOUS

## 2012-03-11 MED ORDER — SUCCINYLCHOLINE CHLORIDE 20 MG/ML IJ SOLN
INTRAMUSCULAR | Status: DC | PRN
  Administered 2012-03-11: 100 mg via INTRAVENOUS

## 2012-03-11 MED ORDER — LIDOCAINE HCL (CARDIAC) 20 MG/ML IV SOLN
INTRAVENOUS | Status: DC | PRN
  Administered 2012-03-11: 60 mg via INTRAVENOUS

## 2012-03-11 MED ORDER — OXYCODONE HCL 5 MG PO TABS: 5.0000 mg | ORAL_TABLET | Freq: Once | ORAL | Status: DC | PRN

## 2012-03-11 MED ORDER — LACTATED RINGERS IV SOLN
INTRAVENOUS | Status: DC | PRN
  Administered 2012-03-11: 09:00:00 via INTRAVENOUS

## 2012-03-11 MED ORDER — FENTANYL CITRATE 0.05 MG/ML IJ SOLN
INTRAMUSCULAR | Status: DC | PRN
  Administered 2012-03-11 (×2): 25 ug via INTRAVENOUS

## 2012-03-11 MED ORDER — OXYMETAZOLINE HCL 0.05 % NA SOLN
NASAL | Status: AC
  Filled 2012-03-11: qty 15

## 2012-03-11 MED ORDER — PROPOFOL 10 MG/ML IV BOLUS
INTRAVENOUS | Status: DC | PRN
  Administered 2012-03-11: 130 mg via INTRAVENOUS

## 2012-03-11 SURGICAL SUPPLY — 36 items
ATTRACTOMAT 16X20 MAGNETIC DRP (DRAPES) IMPLANT
BLADE RAD40 ROTATE 4M 4 5PK (BLADE) IMPLANT
BLADE RAD60 ROTATE M4 4 5PK (BLADE) IMPLANT
BLADE TRICUT ROTATE M4 4 5PK (BLADE) ×2 IMPLANT
CANISTER SUCTION 2500CC (MISCELLANEOUS) ×2 IMPLANT
CLOTH BEACON ORANGE TIMEOUT ST (SAFETY) ×2 IMPLANT
COAGULATOR SUCT SWTCH 10FR 6 (ELECTROSURGICAL) IMPLANT
CRADLE DONUT ADULT HEAD (MISCELLANEOUS) IMPLANT
DRESSING NASAL POPE 10X1.5X2.5 (GAUZE/BANDAGES/DRESSINGS) IMPLANT
DRESSING TELFA 8X3 (GAUZE/BANDAGES/DRESSINGS) IMPLANT
DRSG NASAL POPE 10X1.5X2.5 (GAUZE/BANDAGES/DRESSINGS)
DRSG NASOPORE 8CM (GAUZE/BANDAGES/DRESSINGS) ×1 IMPLANT
ELECT REM PT RETURN 9FT ADLT (ELECTROSURGICAL)
ELECTRODE REM PT RTRN 9FT ADLT (ELECTROSURGICAL) IMPLANT
GLOVE BIO SURGEON STRL SZ7.5 (GLOVE) ×2 IMPLANT
GOWN STRL NON-REIN LRG LVL3 (GOWN DISPOSABLE) ×4 IMPLANT
KIT BASIN OR (CUSTOM PROCEDURE TRAY) ×2 IMPLANT
KIT ROOM TURNOVER OR (KITS) ×2 IMPLANT
NDL SPNL 22GX3.5 QUINCKE BK (NEEDLE) ×1 IMPLANT
NEEDLE 27GAX1X1/2 (NEEDLE) ×2 IMPLANT
NEEDLE SPNL 22GX3.5 QUINCKE BK (NEEDLE) ×2 IMPLANT
NS IRRIG 1000ML POUR BTL (IV SOLUTION) ×2 IMPLANT
PAD ARMBOARD 7.5X6 YLW CONV (MISCELLANEOUS) ×4 IMPLANT
PATTIES SURGICAL .5 X3 (DISPOSABLE) ×2 IMPLANT
SHEATH ENDOSCRUB 0 DEG (SHEATH) ×2 IMPLANT
SHEATH ENDOSCRUB 30 DEG (SHEATH) ×2 IMPLANT
SHEATH ENDOSCRUB 45 DEG (SHEATH) IMPLANT
SOLUTION ANTI FOG 6CC (MISCELLANEOUS) ×2 IMPLANT
SUT ETHILON 3 0 PS 1 (SUTURE) IMPLANT
SWAB COLLECTION DEVICE MRSA (MISCELLANEOUS) IMPLANT
SYR 50ML SLIP (SYRINGE) IMPLANT
TOWEL OR 17X24 6PK STRL BLUE (TOWEL DISPOSABLE) ×2 IMPLANT
TOWEL OR 17X26 10 PK STRL BLUE (TOWEL DISPOSABLE) ×2 IMPLANT
TRAY ENT MC OR (CUSTOM PROCEDURE TRAY) ×2 IMPLANT
TUBE CONNECTING 12X1/4 (SUCTIONS) ×2 IMPLANT
WATER STERILE IRR 1000ML POUR (IV SOLUTION) ×2 IMPLANT

## 2012-03-11 NOTE — Anesthesia Postprocedure Evaluation (Signed)
Anesthesia Post Note  Patient: Martha Price  Procedure(s) Performed: Procedure(s) (LRB): ENDOSCOPIC SINUS SURGERY (Bilateral)  Anesthesia type: General  Patient location: PACU  Post pain: Pain level controlled and Adequate analgesia  Post assessment: Post-op Vital signs reviewed, Patient's Cardiovascular Status Stable, Respiratory Function Stable, Patent Airway and Pain level controlled  Last Vitals:  Filed Vitals:   03/11/12 1204  BP: 153/88  Pulse: 59  Temp: 36.3 C  Resp: 18    Post vital signs: Reviewed and stable  Level of consciousness: awake, alert  and oriented  Complications: No apparent anesthesia complications

## 2012-03-11 NOTE — Preoperative (Signed)
Beta Blockers   Reason not to administer Beta Blockers:Toprol 0530 today

## 2012-03-11 NOTE — Transfer of Care (Signed)
Immediate Anesthesia Transfer of Care Note  Patient: Martha Price  Procedure(s) Performed: Procedure(s) (LRB) with comments: ENDOSCOPIC SINUS SURGERY (Bilateral) - Endoscopic removal of right nasal passage mass  requesting  Patient Location: PACU  Anesthesia Type:General  Level of Consciousness: awake, alert  and patient cooperative  Airway & Oxygen Therapy: Patient Spontanous Breathing and Patient connected to face mask oxygen  Post-op Assessment: Report given to PACU RN, Post -op Vital signs reviewed and stable and Patient moving all extremities  Post vital signs: Reviewed and stable  Complications: No apparent anesthesia complications

## 2012-03-11 NOTE — H&P (Signed)
Martha Price is an 78 y.o. female.   Chief Complaint: right nasal obstruction, mass HPI: 78 year old female with right nasal obstruction for the past month or two.  Has had some bleeding from that side and has clear nasal drainage.  No pain.  Past Medical History  Diagnosis Date  . Dyslipidemia   . HTN (hypertension)   . Iron deficiency anemia   . CAD (coronary artery disease)     remote anterior MI with PTCA to LAD  /  DES to RCA February, 2005, 70% circumflex  /  nuclear, April, 2006, EF 75%, mild ischemia distal anterior wall  . Atrial fibrillation     refused Coumadin in the past., atrial fib chronic as of November, 2010  . History of umbilical hernia repair   . Hyperlipidemia   . Preop cardiovascular exam     Cardiac clearance for inguinal hernia repair under general anesthesia October, 2013  . GERD (gastroesophageal reflux disease)     Past Surgical History  Procedure Date  . Umbilical hernia repair 2011    Dr Abbey Chatters - w mesh  . Laparoscopic cholecystectomy 2002  . Eye surgery     cataracts right and left    Family History  Problem Relation Age of Onset  . Heart attack Father   . Cancer Sister     breast  . Cancer Maternal Aunt     breast   Social History:  reports that she quit smoking about 7 years ago. Her smoking use included Cigarettes. She has a 5 pack-year smoking history. She does not have any smokeless tobacco history on file. She reports that she drinks alcohol. She reports that she does not use illicit drugs.  Allergies: No Known Allergies  Medications Prior to Admission  Medication Sig Dispense Refill  . amLODipine (NORVASC) 5 MG tablet Take 5 mg by mouth daily.      Marland Kitchen aspirin EC 81 MG tablet Take 81 mg by mouth daily.      . benazepril (LOTENSIN) 40 MG tablet Take 40 mg by mouth daily.      . digoxin (LANOXIN) 0.125 MG tablet Take 0.125 mg by mouth daily.      . dorzolamide-timolol (COSOPT) 22.3-6.8 MG/ML ophthalmic solution Place into both  eyes 2 (two) times daily.      . isosorbide mononitrate (IMDUR) 30 MG 24 hr tablet Take 2 tablets (60 mg total) by mouth daily.  60 tablet  5  . isosorbide mononitrate (IMDUR) 30 MG 24 hr tablet Take 60 mg by mouth daily.      Marland Kitchen latanoprost (XALATAN) 0.005 % ophthalmic solution Place 1 drop into both eyes at bedtime.      . metoprolol succinate (TOPROL-XL) 50 MG 24 hr tablet Take 1 tablet (50 mg total) by mouth daily. Take with or immediately following a meal.  15 tablet  6  . pravastatin (PRAVACHOL) 40 MG tablet Take 40 mg by mouth daily.      . nitroGLYCERIN (NITROSTAT) 0.4 MG SL tablet Place 0.4 mg under the tongue every 5 (five) minutes as needed. For chest pain        No results found for this or any previous visit (from the past 48 hour(s)). No results found.  Review of Systems  Musculoskeletal:       Joint pains.  All other systems reviewed and are negative.    Blood pressure 164/86, pulse 68, temperature 97.3 F (36.3 C), temperature source Oral, resp. rate 18, SpO2  99.00%. Physical Exam  Constitutional: She is oriented to person, place, and time. She appears well-developed and well-nourished.  HENT:  Head: Normocephalic and atraumatic.  Right Ear: External ear normal.  Left Ear: External ear normal.  Mouth/Throat: Oropharynx is clear and moist.       Right nasal passage obstructed by soft tissue mass.  Eyes: Conjunctivae normal and EOM are normal. Pupils are equal, round, and reactive to light.  Neck: Normal range of motion. Neck supple.  Cardiovascular: Normal rate.   Respiratory: Effort normal.  GI:       Did not examine.  Genitourinary:       Did not examine.  Musculoskeletal: Normal range of motion.  Neurological: She is alert and oriented to person, place, and time. No cranial nerve deficit.  Skin: Skin is warm and dry.  Psychiatric: She has a normal mood and affect. Her behavior is normal. Judgment and thought content normal.     Assessment/Plan Right nasal  passage obstruction, mass; biopsy consistent with pyogenic granuloma Imaging showed mass to be isolated to anterior nasal passage. To OR for endoscopic removal of right nasal mass.  Jayvan Mcshan 03/11/2012, 9:04 AM

## 2012-03-11 NOTE — Op Note (Signed)
NAME:  Martha Price, PROKOP NO.:  [id number]  MEDICAL RECORD NO.:  [id number]  LOCATION:  [facility]                         FACILITY:  [facility]  PHYSICIAN:  Antony Contras, MD     DATE OF BIRTH:  [birth year]  DATE OF PROCEDURE:  03/11/2012 DATE OF DISCHARGE:  03/11/2012                              OPERATIVE REPORT   PREOPERATIVE DIAGNOSIS:  Right anterior nasal mass.  POSTOPERATIVE DIAGNOSIS:  Bilateral anterior nasal masses.  PROCEDURE:  Endoscopic excision of bilateral nasal masses.  SURGEON:  Antony Contras, MD  ANESTHESIA:  General endotracheal anesthesia.  COMPLICATIONS:  None.  INDICATION:  The patient is an 78 year old female who has noticed obstruction of her right nasal passage for the past month or two associated with some bleeding and clear nasal drainage.  Biopsy in the office suggested pyogenic granuloma and CT imaging demonstrated this to be isolated to the anterior nasal passage.  She presents to the operating room for surgical management.  FINDINGS:  The right nasal passage was largely obstructed with a fleshy mass that was found to be pedicled at the superior extent of the middle turbinate on the lateral nasal wall.  A similar mass was found in the left nasal passage.  It was not quite as large, but similarly pedicled. Both were sent for pathology.  DESCRIPTION OF PROCEDURE:  The patient was identified in the holding room and informed consent having been obtained including the discussion of risks, benefits, and alternatives, the patient was brought to the operative suite and put on the operative table in supine position. Anesthesia was induced, and the patient was intubated by the Anesthesia Team without difficulty.  The eyes were taped closed, and the face was prepped and draped in sterile fashion.  Afrin pledgets were placed in the right side of the nose and left in for several minutes and then removed.  The mass was injected with local anesthetic  consisting of 1% lidocaine with 1:100,000 epinephrine.  Part of the mass was then removed with cup forceps and sent for pathology, and the remainder of the mass was then removed using the microdebrider following it to its pedicle just superior to the root of the middle turbinate on the lateral nasal wall.  The mass extended into the superior nasal vault and olfactory cleft region, but was not pedicled up that way.  After complete removal of involved mucosa and leaving bone in place, Afrin pledgets were placed in that area.  The left nasal passage was inspected and the similar mass was seen.  Afrin pledgets were placed for several minutes and then removed.  The procedure was performed identically on the left side including injection of the mass, partial removed with forceps, and full removal with microdebrider including involved mucosa, but leaving underlying bone.  Again, pledgets were placed on that side.  After several minutes, pledgets were removed and part of a Nasopore pack was coated with Bactroban ointment and placed into each surgical site and saturated with saline.  The throat and nose were then suctioned.  The patient was returned to anesthesia for wake up, and was extubated and moved to the recovery room in stable condition.  Antony Contras, MD     DDB/MEDQ  D:  03/11/2012  T:  03/11/2012  Job:  [id number]

## 2012-03-11 NOTE — Brief Op Note (Signed)
03/11/2012  10:09 AM  PATIENT:  Martha Price  78 y.o. female  PRE-OPERATIVE DIAGNOSIS:  Right Nasal Passage Mass  POST-OPERATIVE DIAGNOSIS:  Bilateral anterior nasal passage mass  PROCEDURE:  Bilateral endoscopic excision of anterior nasal mass  SURGEON:  Surgeon(s) and Role:    * Christia Reading, MD - Primary  PHYSICIAN ASSISTANT:   ASSISTANTS: none   ANESTHESIA:   general  EBL:  Total I/O In: 50 [I.V.:50] Out: -   BLOOD ADMINISTERED:none  DRAINS: none   LOCAL MEDICATIONS USED:  LIDOCAINE   SPECIMEN:  Source of Specimen:  Right and left nasal masses  DISPOSITION OF SPECIMEN:  PATHOLOGY  COUNTS:  YES  TOURNIQUET:  * No tourniquets in log *  DICTATION: .Other Dictation: Dictation Number [id number]  PLAN OF CARE: Discharge to home after PACU  PATIENT DISPOSITION:  PACU - hemodynamically stable.   Delay start of Pharmacological VTE agent (>24hrs) due to surgical blood loss or risk of bleeding: no

## 2012-03-11 NOTE — Anesthesia Preprocedure Evaluation (Addendum)
Anesthesia Evaluation  Patient identified by MRN, date of birth, ID band Patient awake    Reviewed: Allergy & Precautions, H&P , NPO status , Patient's Chart, lab work & pertinent test results, reviewed documented beta blocker date and time   Airway Mallampati: II TM Distance: >3 FB Neck ROM: full    Dental  (+) Teeth Intact and Dental Advisory Given   Pulmonary former smoker,          Cardiovascular hypertension, Pt. on home beta blockers + angina with exertion + CAD and + Cardiac Stents + dysrhythmias Atrial Fibrillation     Neuro/Psych    GI/Hepatic GERD-  Medicated and Controlled,  Endo/Other    Renal/GU      Musculoskeletal   Abdominal   Peds  Hematology   Anesthesia Other Findings   Reproductive/Obstetrics                         Anesthesia Physical Anesthesia Plan  ASA: III  Anesthesia Plan: General   Post-op Pain Management:    Induction: Intravenous  Airway Management Planned: Oral ETT  Additional Equipment:   Intra-op Plan:   Post-operative Plan: Extubation in OR  Informed Consent: I have reviewed the patients History and Physical, chart, labs and discussed the procedure including the risks, benefits and alternatives for the proposed anesthesia with the patient or authorized representative who has indicated his/her understanding and acceptance.     Plan Discussed with: CRNA and Surgeon  Anesthesia Plan Comments:         Anesthesia Quick Evaluation

## 2012-03-11 NOTE — Anesthesia Procedure Notes (Signed)
Procedure Name: Intubation Date/Time: 03/11/2012 9:38 AM Performed by: Marni Griffon Pre-anesthesia Checklist: Patient identified, Emergency Drugs available, Suction available and Patient being monitored Patient Re-evaluated:Patient Re-evaluated prior to inductionOxygen Delivery Method: Circle system utilized Preoxygenation: Pre-oxygenation with 100% oxygen Intubation Type: IV induction Ventilation: Mask ventilation without difficulty Laryngoscope Size: Mac and 4 Grade View: Grade I Tube type: Oral Rae Tube size: 7.0 mm Number of attempts: 2 Placement Confirmation: ETT inserted through vocal cords under direct vision,  breath sounds checked- equal and bilateral and positive ETCO2 ETT to lip (cm): taped to maxilla. Tube secured with: Tape Dental Injury: Teeth and Oropharynx as per pre-operative assessment

## 2012-03-12 ENCOUNTER — Encounter (HOSPITAL_COMMUNITY): Payer: Self-pay | Admitting: Otolaryngology

## 2012-06-22 ENCOUNTER — Encounter: Payer: Self-pay | Admitting: Cardiology

## 2012-07-07 ENCOUNTER — Ambulatory Visit: Payer: Medicare Other | Admitting: Cardiology

## 2012-07-17 ENCOUNTER — Encounter: Payer: Self-pay | Admitting: Cardiology

## 2012-07-19 ENCOUNTER — Encounter: Payer: Self-pay | Admitting: *Deleted

## 2012-07-19 ENCOUNTER — Ambulatory Visit (INDEPENDENT_AMBULATORY_CARE_PROVIDER_SITE_OTHER): Payer: Medicare Other | Admitting: Cardiology

## 2012-07-19 ENCOUNTER — Encounter: Payer: Self-pay | Admitting: Cardiology

## 2012-07-19 VITALS — BP 138/74 | HR 73 | Ht 65.0 in | Wt 118.1 lb

## 2012-07-19 DIAGNOSIS — I10 Essential (primary) hypertension: Secondary | ICD-10-CM

## 2012-07-19 DIAGNOSIS — I4891 Unspecified atrial fibrillation: Secondary | ICD-10-CM

## 2012-07-19 DIAGNOSIS — I2510 Atherosclerotic heart disease of native coronary artery without angina pectoris: Secondary | ICD-10-CM

## 2012-07-19 NOTE — Assessment & Plan Note (Signed)
Atrial fibrillation rate is controlled. No change in therapy. She refuses anticoagulation.

## 2012-07-19 NOTE — Assessment & Plan Note (Signed)
Coronary disease is stable. No change in therapy. 

## 2012-07-19 NOTE — Assessment & Plan Note (Signed)
Blood pressures controlled. No change in therapy. 

## 2012-07-19 NOTE — Patient Instructions (Addendum)
Your physician wants you to follow-up in: YEAR WITH  DR KATZ You will receive a reminder letter in the mail two months in advance. If you don't receive a letter, please call our office to schedule the follow-up appointment. Your physician recommends that you continue on your current medications as directed. Please refer to the Current Medication list given to you today. 

## 2012-07-19 NOTE — Progress Notes (Signed)
HPI  Martha Price is seen for followup atrial fibrillation. I saw her last October, 2013. She has a history of atrial fib. She refuses anticoagulation. She has coronary disease. She has some slight chest discomfort. We proceeded with a nuclear scan. This showed no significant abnormality. She has done well since that time. She's not having any significant problems. Her nuclear study could not be gated.  No Known Allergies  Current Outpatient Prescriptions  Medication Sig Dispense Refill  . amLODipine (NORVASC) 5 MG tablet Take 5 mg by mouth daily.      Marland Kitchen aspirin EC 81 MG tablet Take 81 mg by mouth daily.      . benazepril (LOTENSIN) 40 MG tablet Take 40 mg by mouth daily.      . cephALEXin (KEFLEX) 500 MG capsule Take 1 capsule (500 mg total) by mouth 3 (three) times daily.  21 capsule  0  . digoxin (LANOXIN) 0.125 MG tablet Take 0.125 mg by mouth daily.      . dorzolamide-timolol (COSOPT) 22.3-6.8 MG/ML ophthalmic solution Place into both eyes 2 (two) times daily.      . isosorbide mononitrate (IMDUR) 30 MG 24 hr tablet Take 2 tablets (60 mg total) by mouth daily.  60 tablet  5  . isosorbide mononitrate (IMDUR) 30 MG 24 hr tablet Take 60 mg by mouth daily.      Marland Kitchen latanoprost (XALATAN) 0.005 % ophthalmic solution Place 1 drop into both eyes at bedtime.      . metoprolol succinate (TOPROL-XL) 50 MG 24 hr tablet Take 1 tablet (50 mg total) by mouth daily. Take with or immediately following a meal.  15 tablet  6  . nitroGLYCERIN (NITROSTAT) 0.4 MG SL tablet Place 0.4 mg under the tongue every 5 (five) minutes as needed. For chest pain      . pravastatin (PRAVACHOL) 40 MG tablet Take 40 mg by mouth daily.       No current facility-administered medications for this visit.    History   Social History  . Marital Status: Widowed    Spouse Name: N/A    Number of Children: N/A  . Years of Education: N/A   Occupational History  . Not on file.   Social History Main Topics  . Smoking status:  Former Smoker -- 0.25 packs/day for 20 years    Types: Cigarettes    Quit date: 03/05/2005  . Smokeless tobacco: Not on file  . Alcohol Use: Yes     Comment: wine  . Drug Use: No  . Sexually Active: No   Other Topics Concern  . Not on file   Social History Narrative  . No narrative on file    Family History  Problem Relation Age of Onset  . Heart attack Father   . Cancer Sister     breast  . Cancer Maternal Aunt     breast    Past Medical History  Diagnosis Date  . Dyslipidemia   . HTN (hypertension)   . Iron deficiency anemia   . CAD (coronary artery disease)     remote anterior MI with PTCA to LAD  /  DES to RCA February, 2005, 70% circumflex  /  nuclear, April, 2006, EF 75%, mild ischemia distal anterior wall  . Atrial fibrillation     refused Coumadin in the past., atrial fib chronic as of November, 2010  . History of umbilical hernia repair   . Hyperlipidemia   . Preop cardiovascular exam  Cardiac clearance for inguinal hernia repair under general anesthesia October, 2013  . GERD (gastroesophageal reflux disease)     Past Surgical History  Procedure Laterality Date  . Umbilical hernia repair  2011    Dr Abbey Chatters - w mesh  . Laparoscopic cholecystectomy  2002  . Eye surgery      cataracts right and left  . Nasal sinus surgery  03/11/2012    Procedure: ENDOSCOPIC SINUS SURGERY;  Surgeon: Christia Reading, MD;  Location: [facility] OR;  Service: ENT;  Laterality: Bilateral;  Endoscopic removal of right nasal passage mass  requesting    Martha Price Active Problem List  Diagnosis  . ANEMIA, IRON DEFICIENCY  . Dyslipidemia  . HTN (hypertension)  . CAD (coronary artery disease)  . Atrial fibrillation  . Inguinal hernia, right  . Preop cardiovascular exam    ROS   Martha Price denies fever, chills, headache, sweats, rash, change in vision, change in hearing, chest pain, cough, nausea vomiting, urinary symptoms. All other systems are reviewed and are  negative.  PHYSICAL EXAM  Martha Price is oriented to person time and place. Affect is normal. She is here with her son. There is no jugulovenous distention. Lungs are clear. Respiratory effort is nonlabored. Cardiac exam reveals S1 and S2. There no clicks or significant murmurs. She has kyphoscoliosis of the spine. The abdomen is soft. There is no peripheral edema.  Filed Vitals:   07/19/12 1359  BP: 138/74  Pulse: 73  Height: 5\' 5"  (1.651 m)  Weight: 118 lb 1.9 oz (53.579 kg)  SpO2: 99%     EKG reveals atrial fib with a controlled response. They're old nonspecific ST-T wave changes. There is no significant change.  ASSESSMENT & PLAN

## 2012-08-20 ENCOUNTER — Other Ambulatory Visit: Payer: Self-pay

## 2012-08-20 MED ORDER — ISOSORBIDE MONONITRATE ER 30 MG PO TB24: 60.0000 mg | ORAL_TABLET | Freq: Every day | ORAL | Status: DC

## 2012-08-20 NOTE — Telephone Encounter (Signed)
isosorbide mononitrate (IMDUR) 30 MG 24 hr tablet  Take 60 mg by mouth daily.    Patient Instructions  Your physician wants you to follow-up in:  YEAR WITH DR Chancy Hurter will receive a reminder letter in the mail two months in advance. If you don't receive a letter, please call our office to schedule the follow-up appointment. Your physician recommends that you continue on your current medications as directed. Please refer to the Current Medication list given to you today. Patient Instructions History Recorded  Previous Visit  Provider Department Encounter #  07/07/2012 11:30 AM Willa Rough, MD [facility] [id number]

## 2012-09-29 ENCOUNTER — Other Ambulatory Visit: Payer: Self-pay | Admitting: Cardiology

## 2013-04-28 ENCOUNTER — Other Ambulatory Visit: Payer: Self-pay | Admitting: Cardiology

## 2013-07-22 ENCOUNTER — Ambulatory Visit: Payer: Medicare Other | Admitting: Cardiology

## 2013-07-26 ENCOUNTER — Encounter: Payer: Self-pay | Admitting: Cardiology

## 2013-07-26 ENCOUNTER — Ambulatory Visit (INDEPENDENT_AMBULATORY_CARE_PROVIDER_SITE_OTHER): Payer: Medicare Other | Admitting: Cardiology

## 2013-07-26 ENCOUNTER — Encounter (INDEPENDENT_AMBULATORY_CARE_PROVIDER_SITE_OTHER): Payer: Self-pay

## 2013-07-26 VITALS — BP 138/68 | HR 62 | Ht 65.0 in | Wt 112.0 lb

## 2013-07-26 DIAGNOSIS — I2510 Atherosclerotic heart disease of native coronary artery without angina pectoris: Secondary | ICD-10-CM

## 2013-07-26 DIAGNOSIS — I4891 Unspecified atrial fibrillation: Secondary | ICD-10-CM

## 2013-07-26 DIAGNOSIS — I10 Essential (primary) hypertension: Secondary | ICD-10-CM

## 2013-07-26 NOTE — Assessment & Plan Note (Signed)
Coronary disease is stable. No change in therapy. 

## 2013-07-26 NOTE — Assessment & Plan Note (Signed)
Atrial fibrillation is chronic and stable. She refused anticoagulation in the past. She is stable. No further workup.

## 2013-07-26 NOTE — Patient Instructions (Signed)
Your physician recommends that you continue on your current medications as directed. Please refer to the Current Medication list given to you today.  Your physician wants you to follow-up in: 1 year. You will receive a reminder letter in the mail two months in advance. If you don't receive a letter, please call our office to schedule the follow-up appointment.  

## 2013-07-26 NOTE — Assessment & Plan Note (Signed)
Blood pressure stable. No change in therapy. I'll see her back in one year.

## 2013-07-26 NOTE — Progress Notes (Signed)
Patient ID: Martha Price, female   DOB: 1921-06-08, 80 y.o.   MRN: [id number]    HPI  Patient is seen today to followup atrial fibrillation and coronary disease. From time to time she has slight chest discomfort and takes a nitroglycerin. This pattern is random and has not increased. She is not bothered by any palpitations. She's not having any significant shortness of breath.  No Known Allergies  Current Outpatient Prescriptions  Medication Sig Dispense Refill  . amLODipine (NORVASC) 5 MG tablet Take 5 mg by mouth daily.      Marland Kitchen. aspirin EC 81 MG tablet Take 81 mg by mouth daily.      . benazepril (LOTENSIN) 40 MG tablet Take 40 mg by mouth daily.      . cephALEXin (KEFLEX) 500 MG capsule Take 1 capsule (500 mg total) by mouth 3 (three) times daily.  21 capsule  0  . digoxin (LANOXIN) 0.125 MG tablet Take 0.125 mg by mouth daily.      . dorzolamide-timolol (COSOPT) 22.3-6.8 MG/ML ophthalmic solution Place into both eyes 2 (two) times daily.      . isosorbide mononitrate (IMDUR) 30 MG 24 hr tablet Take 60 mg by mouth daily.      . isosorbide mononitrate (IMDUR) 30 MG 24 hr tablet Take 2 tablets (60 mg total) by mouth daily.  60 tablet  11  . latanoprost (XALATAN) 0.005 % ophthalmic solution Place 1 drop into both eyes at bedtime.      . metoprolol succinate (TOPROL-XL) 50 MG 24 hr tablet taking one half pill w/food or folling a meal      . nitroGLYCERIN (NITROSTAT) 0.4 MG SL tablet Place 0.4 mg under the tongue every 5 (five) minutes as needed. For chest pain      . pravastatin (PRAVACHOL) 40 MG tablet Take 40 mg by mouth daily.       No current facility-administered medications for this visit.    History   Social History  . Marital Status: Widowed    Spouse Name: N/A    Number of Children: N/A  . Years of Education: N/A   Occupational History  . Not on file.   Social History Main Topics  . Smoking status: Former Smoker -- 0.25 packs/day for 20 years    Types: Cigarettes   Quit date: 03/05/2005  . Smokeless tobacco: Not on file  . Alcohol Use: Yes     Comment: wine  . Drug Use: No  . Sexual Activity: No   Other Topics Concern  . Not on file   Social History Narrative  . No narrative on file    Family History  Problem Relation Age of Onset  . Heart attack Father   . Cancer Sister     breast  . Cancer Maternal Aunt     breast    Past Medical History  Diagnosis Date  . Dyslipidemia   . HTN (hypertension)   . Iron deficiency anemia   . CAD (coronary artery disease)     remote anterior MI with PTCA to LAD  /  DES to RCA February, 2005, 70% circumflex  /  nuclear, April, 2006, EF 75%, mild ischemia distal anterior wall  . Atrial fibrillation     refused Coumadin in the past., atrial fib chronic as of November, 2010  . History of umbilical hernia repair   . Hyperlipidemia   . Preop cardiovascular exam     Cardiac clearance for inguinal hernia repair under general  anesthesia October, 2013  . GERD (gastroesophageal reflux disease)     Past Surgical History  Procedure Laterality Date  . Umbilical hernia repair  2011    Dr Abbey Chattersosenbower - w mesh  . Laparoscopic cholecystectomy  2002  . Eye surgery      cataracts right and left  . Nasal sinus surgery  03/11/2012    Procedure: ENDOSCOPIC SINUS SURGERY;  Surgeon: Christia Readingwight Bates, MD;  [facility];  Service: ENT;  Laterality: Bilateral;  Endoscopic removal of right nasal passage mass  requesting 60mins    Patient Active Problem List   Diagnosis Date Noted  . Preop cardiovascular exam   . Inguinal hernia, right 01/19/2012  . Dyslipidemia   . HTN (hypertension)   . CAD (coronary artery disease)   . Atrial fibrillation   . ANEMIA, IRON DEFICIENCY 02/02/2009    ROS   Patient denies fever, chills, headache, sweats, rash, change in vision, change in hearing, cough, nausea vomiting, urinary symptoms. All other systems are reviewed and are negative.  PHYSICAL EXAM  Patient is oriented to  person time and place. Affect is normal. She's here with her son. She looks really quite good. Head is atraumatic. Conjunctiva and sclera are normal. There is no jugulovenous distention. Lungs are clear. Respiratory effort is not labored. She has kyphosis of the thoracic spine. Cardiac exam reveals S1 and S2 and a very soft murmur. The abdomen is soft. There is no peripheral edema. There no musculoskeletal deformities. There are no skin rashes.  Filed Vitals:   07/26/13 1504  BP: 138/68  Pulse: 62  Height: 5\' 5"  (1.651 m)  Weight: 112 lb (50.803 kg)   EKG is done today and reviewed by me. There is old atrial fibrillation. There are nonspecific ST-T wave changes. The rate is controlled. There is no change from the past.  ASSESSMENT & PLAN

## 2013-08-11 ENCOUNTER — Other Ambulatory Visit: Payer: Self-pay | Admitting: Cardiology

## 2013-08-13 ENCOUNTER — Other Ambulatory Visit: Payer: Self-pay | Admitting: Cardiology

## 2013-11-27 ENCOUNTER — Other Ambulatory Visit: Payer: Self-pay | Admitting: Cardiology

## 2014-07-28 ENCOUNTER — Encounter: Payer: Self-pay | Admitting: Cardiology

## 2014-07-28 ENCOUNTER — Ambulatory Visit (INDEPENDENT_AMBULATORY_CARE_PROVIDER_SITE_OTHER): Payer: Medicare Other | Admitting: Cardiology

## 2014-07-28 VITALS — BP 134/60 | HR 53 | Ht 65.0 in | Wt 112.0 lb

## 2014-07-28 DIAGNOSIS — I10 Essential (primary) hypertension: Secondary | ICD-10-CM

## 2014-07-28 DIAGNOSIS — I2510 Atherosclerotic heart disease of native coronary artery without angina pectoris: Secondary | ICD-10-CM

## 2014-07-28 DIAGNOSIS — I482 Chronic atrial fibrillation, unspecified: Secondary | ICD-10-CM

## 2014-07-28 NOTE — Assessment & Plan Note (Signed)
Blood pressures controlled. No change in therapy. 

## 2014-07-28 NOTE — Progress Notes (Signed)
Cardiology Office Note   Date:  07/28/2014   ID:  Martha Price, DOB [birth year], MRN [id number]  PCP:  Gaspar GarbeISOVEC,RICHARD W, MD  Cardiologist:  Willa RoughJeffrey Zakry Caso, MD   Chief Complaint  Patient presents with  . Appointment   follow-up atrial fibrillation    History of Present Illness: Martha Price is a 81 y.o. female who presents today to follow up atrial fibrillation. She has rare chest discomfort. She does not remember any recent pain. Her son says that she has mentioned some on a few occasions taking a nitroglycerin. She's not had any palpitations. There is no syncope or presyncope.    Past Medical History  Diagnosis Date  . Dyslipidemia   . HTN (hypertension)   . Iron deficiency anemia   . CAD (coronary artery disease)     remote anterior MI with PTCA to LAD  /  DES to RCA February, 2005, 70% circumflex  /  nuclear, April, 2006, EF 75%, mild ischemia distal anterior wall  . Atrial fibrillation     refused Coumadin in the past., atrial fib chronic as of November, 2010  . History of umbilical hernia repair   . Hyperlipidemia   . Preop cardiovascular exam     Cardiac clearance for inguinal hernia repair under general anesthesia October, 2013  . GERD (gastroesophageal reflux disease)     Past Surgical History  Procedure Laterality Date  . Umbilical hernia repair  2011    Dr Abbey Chattersosenbower - w mesh  . Laparoscopic cholecystectomy  2002  . Eye surgery      cataracts right and left  . Nasal sinus surgery  03/11/2012    Procedure: ENDOSCOPIC SINUS SURGERY;  Surgeon: Christia Readingwight Bates, MD;  [facility];  Service: ENT;  Laterality: Bilateral;  Endoscopic removal of right nasal passage mass  requesting 60mins    Patient Active Problem List   Diagnosis Date Noted  . Inguinal hernia, right 01/19/2012  . Dyslipidemia   . HTN (hypertension)   . CAD (coronary artery disease)   . Atrial fibrillation   . ANEMIA, IRON DEFICIENCY 02/02/2009      Current Outpatient  Prescriptions  Medication Sig Dispense Refill  . amLODipine (NORVASC) 5 MG tablet Take 5 mg by mouth daily.    Marland Kitchen. aspirin EC 81 MG tablet Take 81 mg by mouth daily.    . benazepril (LOTENSIN) 40 MG tablet Take 40 mg by mouth daily.    . cephALEXin (KEFLEX) 500 MG capsule Take 1 capsule (500 mg total) by mouth 3 (three) times daily. 21 capsule 0  . digoxin (LANOXIN) 0.125 MG tablet Take 0.125 mg by mouth daily.    . dorzolamide-timolol (COSOPT) 22.3-6.8 MG/ML ophthalmic solution Place into both eyes 2 (two) times daily.    . isosorbide mononitrate (IMDUR) 30 MG 24 hr tablet Take 60 mg by mouth daily.    . isosorbide mononitrate (IMDUR) 30 MG 24 hr tablet TAKE 2 TABLETS (60 MG TOTAL) BY MOUTH DAILY. 60 tablet 5  . latanoprost (XALATAN) 0.005 % ophthalmic solution Place 1 drop into both eyes at bedtime.    . metoprolol succinate (TOPROL-XL) 50 MG 24 hr tablet TAKE 1 TABLET (50 MG TOTAL) BY MOUTH DAILY. TAKE WITH OR IMMEDIATELY FOLLOWING A MEAL. 30 tablet 6  . nitroGLYCERIN (NITROSTAT) 0.4 MG SL tablet Place 0.4 mg under the tongue every 5 (five) minutes as needed. For chest pain    . pravastatin (PRAVACHOL) 40 MG tablet Take 40 mg by mouth daily.  No current facility-administered medications for this visit.    Allergies:   Review of patient's allergies indicates no known allergies.    Social History:  The patient  reports that she quit smoking about 9 years ago. Her smoking use included Cigarettes. She has a 5 pack-year smoking history. She does not have any smokeless tobacco history on file. She reports that she drinks alcohol. She reports that she does not use illicit drugs.   Family History:  The patient's family history includes Cancer in her maternal aunt and sister; Heart attack in her father.    ROS:  Please see the history of present illness.     Patient denies fever, chills, headache, sweats, rash, change in vision, change in hearing, cough, nausea or vomiting, urinary symptoms. All  other systems are reviewed and are negative.   PHYSICAL EXAM: VS:  BP 134/60 mmHg  Pulse 53  Ht  (1.651 m)  Wt 112 lb (50.803 kg)  BMI 18.64 kg/m2 , Patient looks stable. She is oriented to person time and place. Affect is normal. I cannot assess what her overall memory capacity is at this time. She is here with her son. Head is atraumatic. Sclera and conjunctiva are normal. There is no jugular venous distention. Lungs are clear. Respiratory effort is nonlabored. Cardiac exam reveals S1 and S2. The rhythm is irregularly irregular. The rate is slow. Abdomen is soft. There is no peripheral edema. She has kyphosis of the thoracic spine. There are no skin rashes.  EKG:   EKG is done today and reviewed by me. There is atrial fibrillation with a slow rate.   Recent Labs: No results found for requested labs within last 365 days.    Lipid Panel No results found for: CHOL, TRIG, HDL, CHOLHDL, VLDL, LDLCALC, LDLDIRECT    Wt Readings from Last 3 Encounters:  07/28/14 112 lb (50.803 kg)  07/26/13 112 lb (50.803 kg)  07/19/12 118 lb 1.9 oz (53.579 kg)      Current medicines are reviewed  The patient's son understands her medicines. She lives with him now.     ASSESSMENT AND PLAN:

## 2014-07-28 NOTE — Assessment & Plan Note (Signed)
Patient had PCI last in 2005. Nuclear scan in 2013 revealed small distal apical infarct but no ischemia. She may have rare pain at this time. She does not appear to be unstable. No further workup is needed.

## 2014-07-28 NOTE — Patient Instructions (Signed)
Medication Instructions:  Stop taking Digoxin  Labwork: None  Testing/Procedures: None  Follow-Up: Your physician wants you to follow-up in: 1 year. You will receive a reminder letter in the mail two months in advance. If you don't receive a letter, please call our office to schedule the follow-up appointment.

## 2014-07-28 NOTE — Assessment & Plan Note (Signed)
Patient has chronic atrial fibrillation. She has refused anticoagulation in the past. Her rate today is on the slower side. I decided to continue her beta blocker but stop her digoxin.

## 2014-09-23 ENCOUNTER — Emergency Department (HOSPITAL_COMMUNITY)
Admission: EM | Admit: 2014-09-23 | Discharge: 2014-09-23 | Disposition: A | Discharge status: Home / Self Care | Payer: Medicare Other | Attending: Emergency Medicine | Admitting: Emergency Medicine

## 2014-09-23 ENCOUNTER — Emergency Department (HOSPITAL_COMMUNITY): Payer: Medicare Other

## 2014-09-23 ENCOUNTER — Encounter (HOSPITAL_COMMUNITY): Payer: Self-pay | Admitting: *Deleted

## 2014-09-23 DIAGNOSIS — R079 Chest pain, unspecified: Secondary | ICD-10-CM | POA: Insufficient documentation

## 2014-09-23 DIAGNOSIS — Z862 Personal history of diseases of the blood and blood-forming organs and certain disorders involving the immune mechanism: Secondary | ICD-10-CM | POA: Diagnosis not present

## 2014-09-23 DIAGNOSIS — E785 Hyperlipidemia, unspecified: Secondary | ICD-10-CM | POA: Diagnosis not present

## 2014-09-23 DIAGNOSIS — Z87891 Personal history of nicotine dependence: Secondary | ICD-10-CM | POA: Insufficient documentation

## 2014-09-23 DIAGNOSIS — M7989 Other specified soft tissue disorders: Secondary | ICD-10-CM | POA: Diagnosis not present

## 2014-09-23 DIAGNOSIS — Z7982 Long term (current) use of aspirin: Secondary | ICD-10-CM | POA: Diagnosis not present

## 2014-09-23 DIAGNOSIS — Z9889 Other specified postprocedural states: Secondary | ICD-10-CM | POA: Insufficient documentation

## 2014-09-23 DIAGNOSIS — I10 Essential (primary) hypertension: Secondary | ICD-10-CM | POA: Insufficient documentation

## 2014-09-23 DIAGNOSIS — I4891 Unspecified atrial fibrillation: Secondary | ICD-10-CM | POA: Diagnosis not present

## 2014-09-23 DIAGNOSIS — Z79899 Other long term (current) drug therapy: Secondary | ICD-10-CM | POA: Insufficient documentation

## 2014-09-23 DIAGNOSIS — Z8719 Personal history of other diseases of the digestive system: Secondary | ICD-10-CM | POA: Insufficient documentation

## 2014-09-23 DIAGNOSIS — I2510 Atherosclerotic heart disease of native coronary artery without angina pectoris: Secondary | ICD-10-CM | POA: Insufficient documentation

## 2014-09-23 LAB — BASIC METABOLIC PANEL
Anion gap: 8 (ref 5–15)
BUN: 21 mg/dL — AB (ref 6–20)
CALCIUM: 8.7 mg/dL — AB (ref 8.9–10.3)
CO2: 21 mmol/L — ABNORMAL LOW (ref 22–32)
Chloride: 107 mmol/L (ref 101–111)
Creatinine, Ser: 0.76 mg/dL (ref 0.44–1.00)
GFR calc Af Amer: >60 mL/min (ref >60)
Glucose, Bld: 116 mg/dL — ABNORMAL HIGH (ref 65–99)
POTASSIUM: 3.5 mmol/L (ref 3.5–5.1)
SODIUM: 136 mmol/L (ref 135–145)

## 2014-09-23 LAB — LIPASE, BLOOD: Lipase: 18 U/L — ABNORMAL LOW (ref 22–51)

## 2014-09-23 LAB — CBC
HEMATOCRIT: 35.6 % — AB (ref 36.0–46.0)
HEMOGLOBIN: 12.2 g/dL (ref 12.0–15.0)
MCH: 31 pg (ref 26.0–34.0)
MCHC: 34.3 g/dL (ref 30.0–36.0)
MCV: 90.6 fL (ref 78.0–100.0)
Platelets: 133 10*3/uL — ABNORMAL LOW (ref 150–400)
RBC: 3.93 MIL/uL (ref 3.87–5.11)
RDW: 15.8 % — ABNORMAL HIGH (ref 11.5–15.5)
WBC: 10.9 10*3/uL — AB (ref 4.0–10.5)

## 2014-09-23 LAB — I-STAT TROPONIN, ED: Troponin i, poc: 0.02 ng/mL (ref 0.00–0.08)

## 2014-09-23 NOTE — Discharge Instructions (Signed)

## 2014-09-23 NOTE — ED Provider Notes (Addendum)
CSN: [id number]     Arrival date & time 09/23/14  1132 History   First MD Initiated Contact with Patient 09/23/14 1144     Chief Complaint  Patient presents with  . Chest Pain     (Consider location/radiation/quality/duration/timing/severity/associated sxs/prior Treatment) Patient is a 81 y.o. female presenting with chest pain. The history is provided by the patient.  Chest Pain Associated symptoms: no abdominal pain, no back pain, no headache, no nausea, no numbness, no shortness of breath, not vomiting and no weakness    patient presents with chest pain. Is on the left side of her chest. Worse with movements. Worse with breathing. Began yesterday and was there all day and may have had some episodes 2 days ago. She does have some difficulty remembering 2 days ago. No fevers. Rare cough. Some chronic swelling in her legs is unchanged. She has a previous history of coronary artery disease and states this does not feel like her previous anginal pain. No relief with nitroglycerin today. No trauma  Past Medical History  Diagnosis Date  . Dyslipidemia   . HTN (hypertension)   . Iron deficiency anemia   . CAD (coronary artery disease)     remote anterior MI with PTCA to LAD  /  DES to RCA February, 2005, 70% circumflex  /  nuclear, April, 2006, EF 75%, mild ischemia distal anterior wall  . Atrial fibrillation     refused Coumadin in the past., atrial fib chronic as of November, 2010  . History of umbilical hernia repair   . Hyperlipidemia   . Preop cardiovascular exam     Cardiac clearance for inguinal hernia repair under general anesthesia October, 2013  . GERD (gastroesophageal reflux disease)    Past Surgical History  Procedure Laterality Date  . Umbilical hernia repair  2011    Dr Abbey Chatters - w mesh  . Laparoscopic cholecystectomy  2002  . Eye surgery      cataracts right and left  . Nasal sinus surgery  03/11/2012    Procedure: ENDOSCOPIC SINUS SURGERY;  Surgeon: Christia Reading,  MD;  Location: [facility] OR;  Service: ENT;  Laterality: Bilateral;  Endoscopic removal of right nasal passage mass  requesting   Family History  Problem Relation Age of Onset  . Heart attack Father   . Cancer Sister     breast  . Cancer Maternal Aunt     breast   History  Substance Use Topics  . Smoking status: Former Smoker -- 0.25 packs/day for 20 years    Types: Cigarettes    Quit date: 03/05/2005  . Smokeless tobacco: Not on file  . Alcohol Use: Yes     Comment: wine   OB History    No data available     Review of Systems  Constitutional: Negative for activity change and appetite change.  Eyes: Negative for pain.  Respiratory: Negative for chest tightness and shortness of breath.   Cardiovascular: Positive for chest pain and leg swelling.  Gastrointestinal: Negative for nausea, vomiting, abdominal pain and diarrhea.  Genitourinary: Negative for flank pain.  Musculoskeletal: Negative for back pain and neck stiffness.  Skin: Negative for rash.  Neurological: Negative for weakness, numbness and headaches.  Psychiatric/Behavioral: Negative for behavioral problems.      Allergies  Review of patient's allergies indicates no known allergies.  Home Medications   Prior to Admission medications   Medication Sig Start Date End Date Taking? Authorizing Provider  amLODipine (NORVASC) 5 MG tablet Take  5 mg by mouth daily.   Yes Historical Provider, MD  aspirin EC 81 MG tablet Take 81 mg by mouth daily.   Yes Historical Provider, MD  benazepril (LOTENSIN) 40 MG tablet Take 40 mg by mouth daily.   Yes Historical Provider, MD  dorzolamide-timolol (COSOPT) 22.3-6.8 MG/ML ophthalmic solution Place into both eyes 2 (two) times daily.   Yes Historical Provider, MD  isosorbide mononitrate (IMDUR) 30 MG 24 hr tablet TAKE 2 TABLETS (60 MG TOTAL) BY MOUTH DAILY.   Yes Luis Abed, MD  latanoprost (XALATAN) 0.005 % ophthalmic solution Place 1 drop into both eyes at bedtime.   Yes  Historical Provider, MD  metoprolol succinate (TOPROL-XL) 50 MG 24 hr tablet TAKE 1 TABLET (50 MG TOTAL) BY MOUTH DAILY. TAKE WITH OR IMMEDIATELY FOLLOWING A MEAL. 11/28/13  Yes Luis Abed, MD  Multiple Vitamin (MULTIVITAMIN WITH MINERALS) TABS tablet Take 1 tablet by mouth daily.   Yes Historical Provider, MD  nitroGLYCERIN (NITROSTAT) 0.4 MG SL tablet Place 0.4 mg under the tongue every 5 (five) minutes as needed. For chest pain   Yes Historical Provider, MD  pravastatin (PRAVACHOL) 40 MG tablet Take 40 mg by mouth daily.   Yes Historical Provider, MD   BP 114/61 mmHg  Pulse 69  Temp(Src) 98.1 F (36.7 C) (Oral)  Resp 20  Ht 5\' 7"  (1.702 m)  Wt 111 lb (50.349 kg)  BMI 17.38 kg/m2  SpO2 99% Physical Exam  Constitutional: She appears well-developed and well-nourished.  HENT:  Head: Normocephalic and atraumatic.  Neck: Neck supple.  Cardiovascular: Normal rate, regular rhythm and normal heart sounds.   No murmur heard. Pulmonary/Chest: Effort normal and breath sounds normal. No respiratory distress. She has no wheezes. She has no rales. She exhibits tenderness.  Tenderness to left lateral lower chest wall. No crepitance or 40. No rash. No upper abdominal tenderness.  Abdominal: Soft. Bowel sounds are normal. She exhibits no distension. There is no tenderness.  Musculoskeletal: Normal range of motion. She exhibits edema.  Bilateral low shotty pitting edema. Chronic per patient.  Neurological: She is alert. No cranial nerve deficit.  Skin: Skin is warm and dry.  Psychiatric: She has a normal mood and affect. Her speech is normal.  Nursing note and vitals reviewed.   ED Course  Procedures (including critical care time) Labs Review Labs Reviewed  CBC - Abnormal; Notable for the following:    WBC 10.9 (*)    HCT 35.6 (*)    RDW 15.8 (*)    Platelets 133 (*)    All other components within normal limits  BASIC METABOLIC PANEL - Abnormal; Notable for the following:    CO2 21 (*)     Glucose, Bld 116 (*)    BUN 21 (*)    Calcium 8.7 (*)    All other components within normal limits  LIPASE, BLOOD - Abnormal; Notable for the following:    Lipase 18 (*)    All other components within normal limits  I-STAT TROPOININ, ED    Imaging Review Dg Chest 2 View  09/23/2014   CLINICAL DATA:  Chest pain.  History of heart attack  EXAM: CHEST  2 VIEW  COMPARISON:  03/05/2012  FINDINGS: Heart size is enlarged. There is aortic atherosclerosis noted. Small bilateral pleural effusions identified. No interstitial edema.  IMPRESSION: 1. Cardiac enlargement and small pleural effusions. 2. Aortic atherosclerosis.   Electronically Signed   By: Signa Kell M.D.   On: 09/23/2014 12:31  EKG Interpretation   Date/Time:  Saturday September 23 2014 11:38:00 EDT Ventricular Rate:  73 PR Interval:    QRS Duration: 85 QT Interval:  366 QTC Calculation: 403 R Axis:   72 Text Interpretation:  Atrial fibrillation Borderline T wave abnormalities  Confirmed by Rubin Payor  MD, Kyndahl Jablon [phone]) on 09/24/2014 6:54:34 AM      MDM   Final diagnoses:  Chest pain, unspecified chest pain type    Patient  With chest pain. Reproducible. Does not feel like her previous angina. Doubt cardiac cause. willl d/c doubt PE     Benjiman Core, MD 09/24/14 [id number]  Benjiman Core, MD 09/24/14 [phone]

## 2014-09-23 NOTE — ED Notes (Signed)
Pt arrived by [facility] from home. Pt having left side chest pain x 2 days, appears to be left mid axilla area. Pain increases with movement, breathing and coughing. Family reported pt having fatigue, weakness and decreased appetite. No sob or n/v. Pt took 4 baby ASA at home and 1 nitro which did not help relieve her pain.

## 2014-09-29 DEATH — deceased

## 2014-10-04 ENCOUNTER — Emergency Department (HOSPITAL_COMMUNITY): Payer: Medicare Other

## 2014-10-04 ENCOUNTER — Encounter (HOSPITAL_COMMUNITY): Payer: Self-pay | Admitting: *Deleted

## 2014-10-04 ENCOUNTER — Inpatient Hospital Stay (HOSPITAL_COMMUNITY)
Admission: EM | Admit: 2014-10-04 | Discharge: 2014-10-30 | DRG: 871 | Disposition: E | Payer: Medicare Other | Attending: Internal Medicine | Admitting: Internal Medicine

## 2014-10-04 DIAGNOSIS — Z9841 Cataract extraction status, right eye: Secondary | ICD-10-CM

## 2014-10-04 DIAGNOSIS — Z8249 Family history of ischemic heart disease and other diseases of the circulatory system: Secondary | ICD-10-CM | POA: Diagnosis not present

## 2014-10-04 DIAGNOSIS — R7989 Other specified abnormal findings of blood chemistry: Secondary | ICD-10-CM

## 2014-10-04 DIAGNOSIS — J189 Pneumonia, unspecified organism: Secondary | ICD-10-CM | POA: Diagnosis present

## 2014-10-04 DIAGNOSIS — I2510 Atherosclerotic heart disease of native coronary artery without angina pectoris: Secondary | ICD-10-CM | POA: Diagnosis present

## 2014-10-04 DIAGNOSIS — Z9842 Cataract extraction status, left eye: Secondary | ICD-10-CM

## 2014-10-04 DIAGNOSIS — I482 Chronic atrial fibrillation: Secondary | ICD-10-CM | POA: Diagnosis present

## 2014-10-04 DIAGNOSIS — D509 Iron deficiency anemia, unspecified: Secondary | ICD-10-CM | POA: Diagnosis present

## 2014-10-04 DIAGNOSIS — I10 Essential (primary) hypertension: Secondary | ICD-10-CM | POA: Diagnosis present

## 2014-10-04 DIAGNOSIS — Z79899 Other long term (current) drug therapy: Secondary | ICD-10-CM | POA: Diagnosis not present

## 2014-10-04 DIAGNOSIS — K219 Gastro-esophageal reflux disease without esophagitis: Secondary | ICD-10-CM | POA: Diagnosis present

## 2014-10-04 DIAGNOSIS — R001 Bradycardia, unspecified: Secondary | ICD-10-CM | POA: Diagnosis not present

## 2014-10-04 DIAGNOSIS — I252 Old myocardial infarction: Secondary | ICD-10-CM

## 2014-10-04 DIAGNOSIS — A419 Sepsis, unspecified organism: Principal | ICD-10-CM | POA: Diagnosis present

## 2014-10-04 DIAGNOSIS — E785 Hyperlipidemia, unspecified: Secondary | ICD-10-CM | POA: Diagnosis present

## 2014-10-04 DIAGNOSIS — R9431 Abnormal electrocardiogram [ECG] [EKG]: Secondary | ICD-10-CM | POA: Diagnosis present

## 2014-10-04 DIAGNOSIS — R9389 Abnormal findings on diagnostic imaging of other specified body structures: Secondary | ICD-10-CM

## 2014-10-04 DIAGNOSIS — Z7982 Long term (current) use of aspirin: Secondary | ICD-10-CM

## 2014-10-04 DIAGNOSIS — N179 Acute kidney failure, unspecified: Secondary | ICD-10-CM | POA: Diagnosis present

## 2014-10-04 DIAGNOSIS — Z955 Presence of coronary angioplasty implant and graft: Secondary | ICD-10-CM | POA: Diagnosis not present

## 2014-10-04 DIAGNOSIS — R05 Cough: Secondary | ICD-10-CM | POA: Diagnosis present

## 2014-10-04 DIAGNOSIS — J9601 Acute respiratory failure with hypoxia: Secondary | ICD-10-CM | POA: Diagnosis present

## 2014-10-04 DIAGNOSIS — R079 Chest pain, unspecified: Secondary | ICD-10-CM | POA: Diagnosis not present

## 2014-10-04 DIAGNOSIS — Z87891 Personal history of nicotine dependence: Secondary | ICD-10-CM

## 2014-10-04 DIAGNOSIS — R778 Other specified abnormalities of plasma proteins: Secondary | ICD-10-CM | POA: Diagnosis present

## 2014-10-04 DIAGNOSIS — I209 Angina pectoris, unspecified: Secondary | ICD-10-CM

## 2014-10-04 DIAGNOSIS — Z66 Do not resuscitate: Secondary | ICD-10-CM | POA: Diagnosis present

## 2014-10-04 DIAGNOSIS — I4891 Unspecified atrial fibrillation: Secondary | ICD-10-CM | POA: Diagnosis present

## 2014-10-04 HISTORY — DX: Acute myocardial infarction, unspecified: I21.9

## 2014-10-04 LAB — COMPREHENSIVE METABOLIC PANEL
ALBUMIN: 2.6 g/dL — AB (ref 3.5–5.0)
ALT: 18 U/L (ref 14–54)
AST: 45 U/L — AB (ref 15–41)
Alkaline Phosphatase: 96 U/L (ref 38–126)
Anion gap: 11 (ref 5–15)
BUN: 21 mg/dL — ABNORMAL HIGH (ref 6–20)
CHLORIDE: 104 mmol/L (ref 101–111)
CO2: 24 mmol/L (ref 22–32)
CREATININE: 0.87 mg/dL (ref 0.44–1.00)
Calcium: 8.3 mg/dL — ABNORMAL LOW (ref 8.9–10.3)
GFR calc non Af Amer: 57 mL/min — ABNORMAL LOW (ref >60)
Glucose, Bld: 163 mg/dL — ABNORMAL HIGH (ref 65–99)
POTASSIUM: 4.3 mmol/L (ref 3.5–5.1)
Sodium: 139 mmol/L (ref 135–145)
Total Bilirubin: 1.1 mg/dL (ref 0.3–1.2)
Total Protein: 6.1 g/dL — ABNORMAL LOW (ref 6.5–8.1)

## 2014-10-04 LAB — PROTIME-INR
INR: 1.32 (ref 0.00–1.49)
Prothrombin Time: 16.5 seconds — ABNORMAL HIGH (ref 11.6–15.2)

## 2014-10-04 LAB — CBC
HEMATOCRIT: 35.8 % — AB (ref 36.0–46.0)
Hemoglobin: 11.7 g/dL — ABNORMAL LOW (ref 12.0–15.0)
MCH: 30.4 pg (ref 26.0–34.0)
MCHC: 32.7 g/dL (ref 30.0–36.0)
MCV: 93 fL (ref 78.0–100.0)
Platelets: 263 10*3/uL (ref 150–400)
RBC: 3.85 MIL/uL — ABNORMAL LOW (ref 3.87–5.11)
RDW: 15.4 % (ref 11.5–15.5)
WBC: 12 10*3/uL — AB (ref 4.0–10.5)

## 2014-10-04 LAB — I-STAT CG4 LACTIC ACID, ED: Lactic Acid, Venous: 5.04 mmol/L (ref 0.5–2.0)

## 2014-10-04 LAB — I-STAT TROPONIN, ED: TROPONIN I, POC: 0.25 ng/mL — AB (ref 0.00–0.08)

## 2014-10-04 LAB — APTT: aPTT: 30 seconds (ref 24–37)

## 2014-10-04 LAB — BRAIN NATRIURETIC PEPTIDE: B Natriuretic Peptide: 304.6 pg/mL — ABNORMAL HIGH (ref 0.0–100.0)

## 2014-10-04 MED ORDER — SODIUM CHLORIDE 0.9 % IV BOLUS (SEPSIS)
1000.0000 mL | Freq: Once | INTRAVENOUS | Status: AC
  Administered 2014-10-04: 1000 mL via INTRAVENOUS

## 2014-10-04 MED ORDER — SODIUM CHLORIDE 0.9 % IV BOLUS (SEPSIS): 500.0000 mL | INTRAVENOUS | Status: AC

## 2014-10-04 MED ORDER — SODIUM CHLORIDE 0.9 % IV BOLUS (SEPSIS)
500.0000 mL | Freq: Once | INTRAVENOUS | Status: AC
  Administered 2014-10-04: 500 mL via INTRAVENOUS

## 2014-10-04 MED ORDER — SODIUM CHLORIDE 0.9 % IV BOLUS (SEPSIS): 500.0000 mL | Freq: Once | INTRAVENOUS | Status: DC

## 2014-10-04 MED ORDER — ASPIRIN 81 MG PO CHEW
324.0000 mg | CHEWABLE_TABLET | Freq: Once | ORAL | Status: AC
  Administered 2014-10-04: 324 mg via ORAL
  Filled 2014-10-04: qty 4

## 2014-10-04 MED ORDER — SODIUM CHLORIDE 0.9 % IV SOLN: 20.0000 mL | INTRAVENOUS | Status: DC

## 2014-10-04 MED ORDER — DILTIAZEM LOAD VIA INFUSION
10.0000 mg | Freq: Once | INTRAVENOUS | Status: AC
  Administered 2014-10-04: 10 mg via INTRAVENOUS
  Filled 2014-10-04: qty 10

## 2014-10-04 MED ORDER — DEXTROSE 5 % IV SOLN
5.0000 mg/h | INTRAVENOUS | Status: DC
  Administered 2014-10-04: 5 mg/h via INTRAVENOUS

## 2014-10-04 MED ORDER — SODIUM CHLORIDE 0.9 % IV SOLN
20.0000 mL | INTRAVENOUS | Status: DC
  Administered 2014-10-04: 20 mL via INTRAVENOUS

## 2014-10-04 MED ORDER — DEXTROSE 5 % IV SOLN
1.0000 g | Freq: Once | INTRAVENOUS | Status: AC
  Administered 2014-10-04: 1 g via INTRAVENOUS
  Filled 2014-10-04: qty 10

## 2014-10-04 MED ORDER — DEXTROSE 5 % IV SOLN
500.0000 mg | Freq: Once | INTRAVENOUS | Status: AC
  Administered 2014-10-04: 500 mg via INTRAVENOUS
  Filled 2014-10-04: qty 500

## 2014-10-30 NOTE — Progress Notes (Signed)
   10/26/2014 1500  Clinical Encounter Type  Visited With Family;Health care provider  Visit Type Initial;Death;ED  Spiritual Encounters  Spiritual Needs Grief support  Stress Factors  Family Stress Factors Loss   Chaplain was paged to patient's room at 3:35 PM and notified that the patient's medical condition had declined and that the family had decided to make the patient a DNR. When chaplain arrived, the physicians had finished consulting the family, however, the patient also passed away. Chaplain provided grief support for patient's son and daughter-in-law. Patient's son has called other family but none of them have been reachable at this time. Patient's son feels like he has had the time he needs at bedside. Chaplain collected funeral home arrangements from the patient's son and communicated these arrangements to patient's nurse. No further support needed at this time. Page Merrilyn Puman-Call chaplain if further grief support needed at a later time.  Cranston NeighborStrother, Tadd Holtmeyer R, Chaplain  3:58 PM

## 2014-10-30 NOTE — ED Notes (Signed)
Cards at bedside

## 2014-10-30 NOTE — Discharge Summary (Signed)
   Physician Death Summary  Martha Price [id number] DOB: 1922/01/27 DOA: 10/21/2014  PCP: Gaspar GarbeISOVEC,RICHARD W, MD  Admit date: 10/14/2014 Death date: 10/15/2014  Discharge Diagnoses:  Principal Problem:   Sepsis due to pneumonia Active Problems:   Iron deficiency anemia   Dyslipidemia   HTN (hypertension)   CAD W/ PRIOR STENT 2005   Atrial fibrillation with RVR   Chest pain   Elevated troponin   Acute respiratory failure with hypoxia   Acute renal failure   CAP (community acquired pneumonia)  Hospital course  Patient just admitted, please refer to H&P, admitted with sepsis due to pneumonia, A fib with RVR. Patient declined quickly and passed away prior to transfer to floor.   Consultations:  Cardiology  Significant Diagnostic Studies: Dg Chest 2 View  09/23/2014   CLINICAL DATA:  Chest pain.  History of heart attack  EXAM: CHEST  2 VIEW  COMPARISON:  03/05/2012  FINDINGS: Heart size is enlarged. There is aortic atherosclerosis noted. Small bilateral pleural effusions identified. No interstitial edema.  IMPRESSION: 1. Cardiac enlargement and small pleural effusions. 2. Aortic atherosclerosis.   Electronically Signed   By: Signa Kellaylor  Stroud M.D.   On: 09/23/2014 12:31   Dg Chest Portable 1 View  10/17/2014   CLINICAL DATA:  Chest pain and shortness of breath.  EXAM: PORTABLE CHEST - 1 VIEW  COMPARISON:  09/23/2014  FINDINGS: Cardiac silhouette is moderately enlarged. Thoracic aortic calcification is noted. A moderate-sized hiatal hernia is noted. There is new airspace consolidation in the right lung base. There may be a small right pleural effusion. The left lung is grossly clear. No pneumothorax is identified. No acute osseous abnormality is seen.  IMPRESSION: Right basilar airspace opacity concerning for pneumonia. Followup PA and lateral chest X-ray is recommended in 3-4 weeks following trial of antibiotic therapy to ensure resolution and exclude underlying malignancy.   Electronically  Signed   By: Sebastian AcheAllen  Grady   On: Apr 27, 2014 13:34   Labs: Basic Metabolic Panel:  Recent Labs Lab 2014/04/23 1355  NA 139  K 4.3  CL 104  CO2 24  GLUCOSE 163*  BUN 21*  CREATININE 0.87  CALCIUM 8.3*   Liver Function Tests:  Recent Labs Lab 2014/04/23 1355  AST 45*  ALT 18  ALKPHOS 96  BILITOT 1.1  PROT 6.1*  ALBUMIN 2.6*   CBC:  Recent Labs Lab 2014/04/23 1355  WBC 12.0*  HGB 11.7*  HCT 35.8*  MCV 93.0  PLT 263   BNP: BNP (last 3 results)  Recent Labs  2014/04/23 1457  BNP 304.6*     Signed:  Bambi Fehnel  [facility] 10/15/2014, 4:03 PM

## 2014-10-30 NOTE — ED Notes (Signed)
Admitting at bedside 

## 2014-10-30 NOTE — ED Notes (Signed)
Per Margarita Sermonsharles Pallas, pts son, also Power of [employer] College[employer], has verbally spoken with Dr. Rosalia Hammersay and Myself and has requested that his mother be a DNR.

## 2014-10-30 NOTE — H&P (Signed)
[facility] History and Physical                                                                                    Ariely Riddell, is a 81 y.o. female  MRN: [id number]   DOB - 10-27-1921  Admit Date - 05-Oct-2014  Outpatient Primary MD for the patient is Haywood Pao, MD  Referring MD: Jeanell Sparrow / ER  Consulting M.D: Velora Heckler Card[facility]  With History of -  Past Medical History  Diagnosis Date  . Dyslipidemia   . HTN (hypertension)   . Iron deficiency anemia   . CAD (coronary artery disease)     remote anterior MI with PTCA to LAD  /  DES to RCA February, 2005, 70% circumflex  /  nuclear, April, 2006, EF 75%, mild ischemia distal anterior wall  . Atrial fibrillation     refused Coumadin in the past., atrial fib chronic as of November, 2010  . History of umbilical hernia repair   . Hyperlipidemia   . Preop cardiovascular exam     Cardiac clearance for inguinal hernia repair under general anesthesia October, 2013  . GERD (gastroesophageal reflux disease)   . MI (myocardial infarction)     X 2      Past Surgical History  Procedure Laterality Date  . Umbilical hernia repair  2011    Dr Zella Richer - w mesh  . Laparoscopic cholecystectomy  2002  . Eye surgery      cataracts right and left  . Nasal sinus surgery  03/11/2012    Procedure: ENDOSCOPIC SINUS SURGERY;  Surgeon: Melida Quitter, MD;  Location: [place];  Service: ENT;  Laterality: Bilateral;  Endoscopic removal of right nasal passage mass  requesting 19mns    in for   Chief Complaint  Patient presents with  . Chest Pain  . Shortness of Breath     HPI This is a 81year old female patient with past medical history of atrial fibrillation who has refused Coumadin in the past, CAD with prior stents in 2005, iron deficient she anemia, dyslipidemia, hypertension who presented to the hospital with complaints of chest pain and shortness of breath. Patient initially was evaluated in the ER on 6/25 after presenting with  complaints of left side chest pain. Workup during that evaluation was unremarkable and patient was discharged back to home. Son accompanies patient today and is assisting with the history due to patient's heart appearing. He reports that since last evaluated in the ER patient has had excessive sleepiness, poor oral intake and has had a productive cough with blood-tinged sputum. He reports today his mother awakened and was complaining of constant substernal chest discomfort. He gave her 3 nitroglycerin without resolution and pain. He called EMS who instructed the son to give for baby aspirin which she did and patient did not have improvement in pain. She was subsequently transported to the hospital.  In route to the hospital patient was given an additional 4 baby aspirin and by the time she arrived to the ER she apparently was pain-free. She was afebrile but was tachypnea with respiratory rate anywhere between 28 and 32 breaths per minute, she was in  atrial fibrillation ventricular rate as high as the 120s. Her initial blood pressure was 104/61 and did dip down to 92/60 with heart rates in the 120s. This prompted the EDP to begin a Cardizem infusion. She was also hypoxemic and was placed on nasal cannula oxygen. Unfortunately pre-oxygen O2 saturations were not documented. Electrolyte panel was unremarkable except for mildly elevated BUN of 21, with normal creatinine 0.87 and slight decrease in GFR from baseline. AST was elevated at 45, albumen was low at 2.6. Troponin was 0.25 noting on 6/25 troponin was normal at 0.02. White count was elevated at 12,000 with a hemoglobin of 11.7 and platelets were normal 263,000. Crit was slightly elevated at 163. Chest x-ray was unremarkable except for atrial fibrillation with the elevated ventricular response, she does have some chronic downsloping in her inferior lateral leads. Chest x-ray completed at 1:30 PM revealed right basilar airspace opacity concerning for pneumonia.  Patient has been given initial doses of Rocephin and Zithromax by the EDP. She has also been given 1000 mL of IV fluids since arrival to the ER. The time I arrived to evaluate the patient her respiratory rate had increased into the 30s her heart rate was in the 120s and her O2 demands had increased to 6 L/m with sats of 91%. Discussed with the patient's son and patient is a DO NOT RESUSCITATE but he is agreeable to BiPAP if indicated patient is now primarily complaining of right-sided chest discomfort that may or may not have a pleuritic component. History taking has been limited because of patient's decreased hearing and history has been obtained primarily from son. He confirmed patient was having productive blood-streaked sputum with coughing at home. He also confirmed that patient appears quite pale in appearance. Patient was able to confirm she is not having any awareness of her heart racing.   Review of Systems   In addition to the HPI above,  No Headache, changes with Vision or hearing, new weakness, tingling, numbness in any extremity, No problems swallowing food or Liquids, indigestion/reflux No palpitations, orthopnea or DOE No Abdominal pain, N/V; no melena or hematochezia, no dark tarry stools, Bowel movements are regular, No dysuria, hematuria or flank pain No new skin rashes, lesions, masses or bruises, No new joints pains-aches No recent weight gain or loss No polyuria, polydypsia or polyphagia,  *A full 10 point Review of Systems was done, except as stated above, all other Review of Systems were negative.  Social History History  Substance Use Topics  . Smoking status: Former Smoker -- 0.25 packs/day for 20 years    Types: Cigarettes    Quit date: 03/05/2005  . Smokeless tobacco: Not on file  . Alcohol Use: Yes     Comment: wine    Resides at: Private residence  Lives with: Son  Ambulatory status: Without assistive devices   Family History Family History  Problem  Relation Age of Onset  . Heart attack Father   . Cancer Sister     breast  . Cancer Maternal Aunt     breast    Prior to Admission medications   Medication Sig Start Date End Date Taking? Authorizing Provider  amLODipine (NORVASC) 5 MG tablet Take 5 mg by mouth daily.    Historical Provider, MD  aspirin EC 81 MG tablet Take 81 mg by mouth daily.    Historical Provider, MD  benazepril (LOTENSIN) 40 MG tablet Take 40 mg by mouth daily.    Historical Provider, MD  dorzolamide-timolol (COSOPT)  22.3-6.8 MG/ML ophthalmic solution Place into both eyes 2 (two) times daily.    Historical Provider, MD  isosorbide mononitrate (IMDUR) 30 MG 24 hr tablet TAKE 2 TABLETS (60 MG TOTAL) BY MOUTH DAILY.    Carlena Bjornstad, MD  latanoprost (XALATAN) 0.005 % ophthalmic solution Place 1 drop into both eyes at bedtime.    Historical Provider, MD  metoprolol succinate (TOPROL-XL) 25 MG 24 hr tablet Take 12.5 mg by mouth daily. 09/17/14   Historical Provider, MD  metoprolol succinate (TOPROL-XL) 50 MG 24 hr tablet TAKE 1 TABLET (50 MG TOTAL) BY MOUTH DAILY. TAKE WITH OR IMMEDIATELY FOLLOWING A MEAL. 11/28/13   Carlena Bjornstad, MD  Multiple Vitamin (MULTIVITAMIN WITH MINERALS) TABS tablet Take 1 tablet by mouth daily.    Historical Provider, MD  nitroGLYCERIN (NITROSTAT) 0.4 MG SL tablet Place 0.4 mg under the tongue every 5 (five) minutes as needed. For chest pain    Historical Provider, MD  pravastatin (PRAVACHOL) 40 MG tablet Take 40 mg by mouth daily.    Historical Provider, MD    No Known Allergies  Physical Exam  Vitals  Blood pressure 92/60, pulse 120, temperature 98.9 F (37.2 C), temperature source Rectal, resp. rate 30, height 5' 5"  (1.651 m), weight 111 lb (50.349 kg), SpO2 91 %.   General:  In acute distress as evidenced by ongoing tachycardia and tachypnea with overall pale appearance and cool extremities, frail-appearing and underweight  Psych: Anxious and exam limited by patient's hearing loss,  appears at baseline to be mostly oriented  Neuro:   No focal neurological deficits, CN II through XII intact, Strength 4/5 all 4 extremities, Sensation intact all 4 extremities.  ENT:  Ears and Eyes appear Normal, Conjunctivae clear, PER. Moist oral mucosa without erythema or exudates.  Neck:  Supple, No lymphadenopathy appreciated  Respiratory:  Symmetrical chest wall movement, tachypnea, Good air movement bilaterally, crackles right mid field down. 6 L oxygen  Cardiac:  Irregular in atrial fibrillation with ventricular rates up to the 120s, No Murmurs, no LE edema noted, no JVD, No carotid bruits, peripheral pulses palpable at 2+, Cardizem infusion at 5 mg per hour  Abdomen:  Positive hypoactive bowel sounds, Soft, Non tender, Non distended,  No masses appreciated, no obvious hepatosplenomegaly  Skin:  No Cyanosis, Normal Skin Turgor, No Skin Rash or Bruise.  Extremities: Symmetrical without obvious trauma or injury,  no effusions.  Data Review  CBC  Recent Labs Lab 14-Oct-2014 1355  WBC 12.0*  HGB 11.7*  HCT 35.8*  PLT 263  MCV 93.0  MCH 30.4  MCHC 32.7  RDW 15.4    Chemistries   Recent Labs Lab 14-Oct-2014 1355  NA 139  K 4.3  CL 104  CO2 24  GLUCOSE 163*  BUN 21*  CREATININE 0.87  CALCIUM 8.3*  AST 45*  ALT 18  ALKPHOS 96  BILITOT 1.1    estimated creatinine clearance is 34.1 mL/min (by C-G formula based on Cr of 0.87).  No results for input(s): TSH, T4TOTAL, T3FREE, THYROIDAB in the last 72 hours.  Invalid input(s): FREET3  Coagulation profile  Recent Labs Lab October 14, 2014 1355  INR 1.32    No results for input(s): DDIMER in the last 72 hours.  Cardiac Enzymes No results for input(s): CKMB, TROPONINI, MYOGLOBIN in the last 168 hours.  Invalid input(s): CK  Invalid input(s): POCBNP  Urinalysis No results found for: COLORURINE, APPEARANCEUR, LABSPEC, [place], GLUCOSEU, HGBUR, BILIRUBINUR, KETONESUR, PROTEINUR, UROBILINOGEN, NITRITE,  LEUKOCYTESUR  Imaging  results:   Dg Chest 2 View  09/23/2014   CLINICAL DATA:  Chest pain.  History of heart attack  EXAM: CHEST  2 VIEW  COMPARISON:  03/05/2012  FINDINGS: Heart size is enlarged. There is aortic atherosclerosis noted. Small bilateral pleural effusions identified. No interstitial edema.  IMPRESSION: 1. Cardiac enlargement and small pleural effusions. 2. Aortic atherosclerosis.   Electronically Signed   By: Kerby Moors M.D.   On: 09/23/2014 12:31   Dg Chest Portable 1 View  10/28/14   CLINICAL DATA:  Chest pain and shortness of breath.  EXAM: PORTABLE CHEST - 1 VIEW  COMPARISON:  09/23/2014  FINDINGS: Cardiac silhouette is moderately enlarged. Thoracic aortic calcification is noted. A moderate-sized hiatal hernia is noted. There is new airspace consolidation in the right lung base. There may be a small right pleural effusion. The left lung is grossly clear. No pneumothorax is identified. No acute osseous abnormality is seen.  IMPRESSION: Right basilar airspace opacity concerning for pneumonia. Followup PA and lateral chest X-ray is recommended in 3-4 weeks following trial of antibiotic therapy to ensure resolution and exclude underlying malignancy.   Electronically Signed   By: Logan Bores   On: Oct 28, 2014 13:34     EKG: (Independently reviewed) each her fibrillation with ventricular rates in the 120s   Assessment & Plan  Principal Problem:   Sepsis due to pneumonia -Admit to stepdown -DO NOT RESUSCITATE status but okay with escalating up to BiPAP only -Sepsis protocol to include cycling the Procalcitonin and lactic acid -Sepsis criteria and physiology met by tachycardia, tachypnea, relative hypotension, source of infection, and elevated lactic acid -Follow up on blood cultures  Active Problems:   Atrial fibrillation with RVR -Suspect is physiologic response to sepsis -Agree with rate controlling agent so we'll continue IV Cardizem -For now continue home Toprol as  blood pressure tolerates-if blood pressure remains soft consider utilizing when necessary IV Lopressor    Acute respiratory failure with hypoxia secondary to community acquired pneumonia -Continue Zithromax and Rocephin -Supportive care with oxygen and flutter valve -Xopenex if develops wheezing    CAD W/ PRIOR STENT 2005/Chest pain/Elevated troponin -Suspect chest pain primarily pleuritic in nature from underlying pneumonia -Suspect elevated troponin primarily related to demand ischemia but with known CAD we'll cycle cardiac enzymes -Await formal cardiology evaluation    Acute renal failure -Secondary to pneumonia and evolving sepsis -Repeat lab in a.m.    Iron deficiency anemia -Hemoglobin stable and at baseline    Dyslipidemia -Continue preadmission Pravachol-mild elevation in AST secondary to sepsis-if liver enzymes continue to increase will need to discontinue statin    HTN  -Toprol as above -Hold Norvasc, Lotensin, and Imdur    DVT Prophylaxis: Lovenox  Family Communication:   Son and daughter-in-law at bedside  Code Status:  DO NOT RESUSCITATE  Condition:  Guarded  Discharge disposition: If patient survives hospitalization anticipate discharge back to home pending resolution of sepsis physiology and also after formal PT/OT evaluation when patient more hemodynamically stable  At 53 attending physician went to room to evaluate patient and found patient deceased: She was apneic and pulseless.  Time spent in minutes : 60      ELLIS,ALLISON L. ANP on 10/28/2014 at 3:32 PM  Between 7am to 7pm - Pager - [phone]  After 7pm go to [email or web address] - password [email or web address]  And look for the night coverage person covering me after hours  [facility]   On my evaluation patient had already expired. DNR  orders already in place and confirmed by son, discussed with family and attending ER Dr. Jeanell Sparrow who was in the room.   Marzetta Board, MD [facility] [phone]

## 2014-10-30 NOTE — ED Provider Notes (Addendum)
CSN: [id number]     Arrival date & time 10/29/2014  1252 History   First MD Initiated Contact with Patient 05-Feb-2015 1257     Chief Complaint  Patient presents with  . Chest Pain  . Shortness of Breath    Level V caveat the obtained from patient's son (Consider location/radiation/quality/duration/timing/severity/associated sxs/prior Treatment) HPI 81 year old female who presents today complaining of central chest pain that began around 11:30 this morning. She complained of chest pain and shortness of breath to her son. He gave her nitroglycerin that are reported to have not had affect. However, she is not currently complaining of any chest pain. She also received baby aspirin prior to coming to the hospital. She has a history of atrial fibrillation Past Medical History  Diagnosis Date  . Dyslipidemia   . HTN (hypertension)   . Iron deficiency anemia   . CAD (coronary artery disease)     remote anterior MI with PTCA to LAD  /  DES to RCA February, 2005, 70% circumflex  /  nuclear, April, 2006, EF 75%, mild ischemia distal anterior wall  . Atrial fibrillation     refused Coumadin in the past., atrial fib chronic as of November, 2010  . History of umbilical hernia repair   . Hyperlipidemia   . Preop cardiovascular exam     Cardiac clearance for inguinal hernia repair under general anesthesia October, 2013  . GERD (gastroesophageal reflux disease)   . MI (myocardial infarction)     X 2   Past Surgical History  Procedure Laterality Date  . Umbilical hernia repair  2011    Dr Abbey Chattersosenbower - w mesh  . Laparoscopic cholecystectomy  2002  . Eye surgery      cataracts right and left  . Nasal sinus surgery  03/11/2012    Procedure: ENDOSCOPIC SINUS SURGERY;  Surgeon: Christia Readingwight Bates, MD;  Location: [facility] OR;  Service: ENT;  Laterality: Bilateral;  Endoscopic removal of right nasal passage mass  requesting 60mins   Family History  Problem Relation Age of Onset  . Heart attack Father   . Cancer  Sister     breast  . Cancer Maternal Aunt     breast   History  Substance Use Topics  . Smoking status: Former Smoker -- 0.25 packs/day for 20 years    Types: Cigarettes    Quit date: 03/05/2005  . Smokeless tobacco: Not on file  . Alcohol Use: Yes     Comment: wine   OB History    No data available     Review of Systems    Allergies  Review of patient's allergies indicates no known allergies.  Home Medications   Prior to Admission medications   Medication Sig Start Date End Date Taking? Authorizing Provider  amLODipine (NORVASC) 5 MG tablet Take 5 mg by mouth daily.    Historical Provider, MD  aspirin EC 81 MG tablet Take 81 mg by mouth daily.    Historical Provider, MD  benazepril (LOTENSIN) 40 MG tablet Take 40 mg by mouth daily.    Historical Provider, MD  dorzolamide-timolol (COSOPT) 22.3-6.8 MG/ML ophthalmic solution Place into both eyes 2 (two) times daily.    Historical Provider, MD  isosorbide mononitrate (IMDUR) 30 MG 24 hr tablet TAKE 2 TABLETS (60 MG TOTAL) BY MOUTH DAILY.    Luis AbedJeffrey D Katz, MD  latanoprost (XALATAN) 0.005 % ophthalmic solution Place 1 drop into both eyes at bedtime.    Historical Provider, MD  metoprolol succinate (TOPROL-XL)  50 MG 24 hr tablet TAKE 1 TABLET (50 MG TOTAL) BY MOUTH DAILY. TAKE WITH OR IMMEDIATELY FOLLOWING A MEAL. 11/28/13   Luis Abed, MD  Multiple Vitamin (MULTIVITAMIN WITH MINERALS) TABS tablet Take 1 tablet by mouth daily.    Historical Provider, MD  nitroGLYCERIN (NITROSTAT) 0.4 MG SL tablet Place 0.4 mg under the tongue every 5 (five) minutes as needed. For chest pain    Historical Provider, MD  pravastatin (PRAVACHOL) 40 MG tablet Take 40 mg by mouth daily.    Historical Provider, MD   BP 99/64 mmHg  Pulse 69  Temp(Src) 97.4 F (36.3 C) (Oral)  Resp 33  Ht  (1.651 m)  Wt 111 lb (50.349 kg)  BMI 18.47 kg/m2  SpO2 81% Physical Exam  ED Course  Procedures (including critical care time) Labs Review Labs  Reviewed  CBC - Abnormal; Notable for the following:    WBC 12.0 (*)    RBC 3.85 (*)    Hemoglobin 11.7 (*)    HCT 35.8 (*)    All other components within normal limits  I-STAT TROPOININ, ED - Abnormal; Notable for the following:    Troponin i, poc 0.25 (*)    All other components within normal limits  APTT  COMPREHENSIVE METABOLIC PANEL  PROTIME-INR  BRAIN NATRIURETIC PEPTIDE  I-STAT CG4 LACTIC ACID, ED    Imaging Review Dg Chest Portable 1 View  2014/10/07   CLINICAL DATA:  Chest pain and shortness of breath.  EXAM: PORTABLE CHEST - 1 VIEW  COMPARISON:  09/23/2014  FINDINGS: Cardiac silhouette is moderately enlarged. Thoracic aortic calcification is noted. A moderate-sized hiatal hernia is noted. There is new airspace consolidation in the right lung base. There may be a small right pleural effusion. The left lung is grossly clear. No pneumothorax is identified. No acute osseous abnormality is seen.  IMPRESSION: Right basilar airspace opacity concerning for pneumonia. Followup PA and lateral chest X-Dawon Troop is recommended in 3-4 weeks following trial of antibiotic therapy to ensure resolution and exclude underlying malignancy.   Electronically Signed   By: Sebastian Ache   On: Oct 07, 2014 13:34     EKG Interpretation   Date/Time:  Wednesday 2014/10/07 12:53:15 EDT Ventricular Rate:  125 PR Interval:    QRS Duration: 87 QT Interval:  280 QTC Calculation: 404 R Axis:   99 Text Interpretation:  Atrial fibrillation Anterior infarct, old  Non-specific change in ST segment in v4-v6 Confirmed by Vung Kush MD, Duwayne Heck  ([id number]) on 10-07-14 2:27:50 PM      MDM   Final diagnoses:  Atrial fibrillation with RVR  Ischemic chest pain  New abnormality on chest x-Artez Regis  Elevated troponin  Sepsis, due to unspecified organism  DNR (do not resuscitate)    This is a 81 year old female with onset of chest pain and dyspnea today. Here she said to be in atrial fibrillation with a rapid ventricular  sponsor nonspecific ST changes in her lateral leads. Rate was controlled with 10 mg of Cardizem and is on a 5 mg Cardizem drip. She is also having a 500 mL bolus infused. Blood pressures were borderline hypotensive with systolic pressures of 90 initially. After rate control her systolic blood pressure has increased to 100. She has an elevated troponin at 0.12. She did have an onset of chest pain today and has nonspecific changes. However, this could be from the atrial fibrillation. She also has a infiltrate seen in the right basilar area. This could be atelectasis versus  CHF versus pneumonia. She is treated empirically here with Rocephin and Zithromax. Currently, cardiology is being consulted due to history of chest pain, abnormal EKG, and elevated troponin which could represent nSTEMI or low flow state due to atrial fibrillation and infection. Patient has not had chest pain here. However, she has stated that she just feels generally very poorly. Discussed with cardiology and they will see. Hospitalist consulted. Discussed with son, Virl Diamond, and he is power of attorney. He states that she wishes to be DO NOT RESUSCITATE. Marchelle Folks, RN has witnessed and will have him sign papers appropriately.  Margarita Grizzle, MD 09/29/2014 1449  Elevated lactic acid at 5 noted. Patient systolic blood pressure currently 90. Additional IV fluid bolus added. Plan discuss with hospitalist to be sure they are aware of elevated lactic acid. Patient is dnr   Margarita Grizzle, MD 10/12/2014 1520 CRITICAL CARE Performed by: Hilario Quarry Total critical care time: 60 Critical care time was exclusive of separately billable procedures and treating other patients. Critical care was necessary to treat or prevent imminent or life-threatening deterioration. Critical care was time spent personally by me on the following activities: development of treatment plan with patient and/or surrogate as well as nursing, discussions with consultants,  evaluation of patient's response to treatment, examination of patient, obtaining history from patient or surrogate, ordering and performing treatments and interventions, ordering and review of laboratory studies, ordering and review of radiographic studies, pulse oximetry and re-evaluation of patient's condition.  Margarita Grizzle, MD 10/23/2014 1610  Margarita Grizzle, MD 10/10/2014 1526  Called to bedside. She became unresponsive. Patient with agonal respirations. Son and daughter-in-law at bedside. Confirmed that they do not want interventions. She became bradycardic on monitor. Pulses stopped at 1545 and time of death 1545. Dr. Elvera Lennox will sign a death certificate  Margarita Grizzle, MD 10/08/2014 1549  Margarita Grizzle, MD 10/20/14 1536

## 2014-10-30 NOTE — Progress Notes (Signed)
Cardiology consulted for chest pain and elevated trop in the setting of RLL PNA and likely sepsis with venous lactic acid 5. Patient expired prior to finishing consult. Discussed with Dr. Katrinka BlazingSmith.  Ramond DialSigned, Erinn Huskins PA Pager: [phone]

## 2014-10-30 NOTE — ED Notes (Signed)
Pt expired at 15:45

## 2014-10-30 NOTE — ED Notes (Signed)
Per [facility] pt stated that she had a sudden onset of central CP associated with SOB.  Pts son gave her 3 nitro 5 mins apart and  of ASA, without any relief.  Pts son called EMS to bring her to the hospital.  Pt is in AFIB and VS are as follows: BP:138/80 HR: bt 108/140 O2sat: 92% on RA

## 2014-10-30 NOTE — Progress Notes (Signed)
CHADVASc was 4 for dx of AF/RVR on chronic AF not on anticoagu;ation per pt request  Junious SilkAllison Akila Batta ,ANP

## 2014-10-30 DEATH — deceased

## 2015-08-27 IMAGING — CR DG CHEST 1V PORT
1 series · 1 of 1 positions shown · non-contrast
Comparison: 09/23/2014

CLINICAL DATA: Chest pain and shortness of breath.

EXAM:
PORTABLE CHEST - 1 VIEW

[AP]
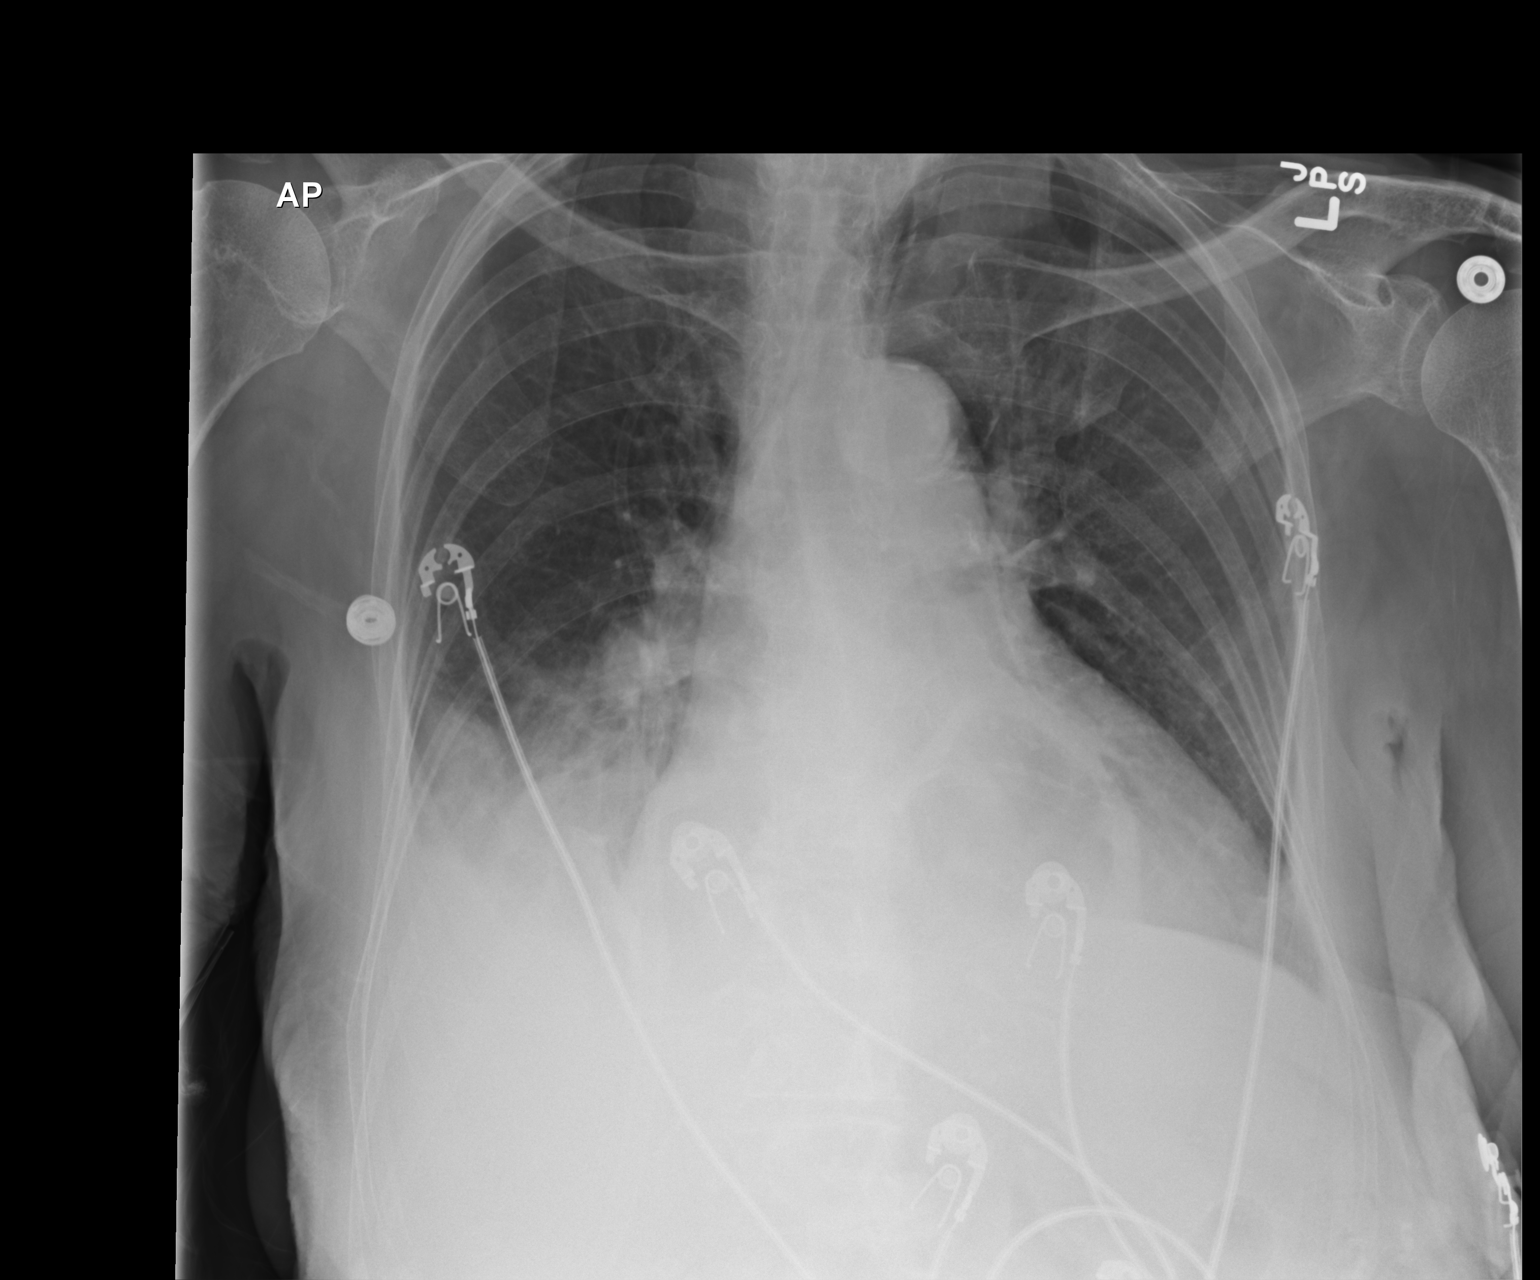

[1 of 1 positions shown; findings below may reference images not displayed]

FINDINGS: Cardiac silhouette is moderately enlarged. Thoracic aortic
calcification is noted. A moderate-sized hiatal hernia is noted.
There is new airspace consolidation in the right lung base. There
may be a small right pleural effusion. The left lung is grossly
clear. No pneumothorax is identified. No acute osseous abnormality
is seen.
IMPRESSION: Right basilar airspace opacity concerning for pneumonia. Followup PA
and lateral chest X-ray is recommended in 3-4 weeks following trial
of antibiotic therapy to ensure resolution and exclude underlying
malignancy.
